# Patient Record
Sex: Female | Born: 1970 | Race: White | Hispanic: No | Marital: Married | Smoking: Never smoker
Health system: Southern US, Community
[De-identification: ages and names within clinical notes are randomized; demographics above are authoritative.]

## PROBLEM LIST (undated history)

## (undated) DIAGNOSIS — F329 Major depressive disorder, single episode, unspecified: Secondary | ICD-10-CM

## (undated) DIAGNOSIS — F4321 Adjustment disorder with depressed mood: Secondary | ICD-10-CM

## (undated) DIAGNOSIS — N393 Stress incontinence (female) (male): Secondary | ICD-10-CM

## (undated) DIAGNOSIS — N390 Urinary tract infection, site not specified: Secondary | ICD-10-CM

## (undated) DIAGNOSIS — E039 Hypothyroidism, unspecified: Secondary | ICD-10-CM

## (undated) DIAGNOSIS — J45909 Unspecified asthma, uncomplicated: Secondary | ICD-10-CM

## (undated) DIAGNOSIS — D069 Carcinoma in situ of cervix, unspecified: Secondary | ICD-10-CM

## (undated) DIAGNOSIS — R51 Headache: Secondary | ICD-10-CM

## (undated) DIAGNOSIS — R519 Headache, unspecified: Secondary | ICD-10-CM

## (undated) DIAGNOSIS — R635 Abnormal weight gain: Secondary | ICD-10-CM

## (undated) DIAGNOSIS — E079 Disorder of thyroid, unspecified: Secondary | ICD-10-CM

## (undated) DIAGNOSIS — R9431 Abnormal electrocardiogram [ECG] [EKG]: Principal | ICD-10-CM

## (undated) DIAGNOSIS — K219 Gastro-esophageal reflux disease without esophagitis: Secondary | ICD-10-CM

## (undated) DIAGNOSIS — R5383 Other fatigue: Secondary | ICD-10-CM

## (undated) HISTORY — PX: WISDOM TOOTH EXTRACTION: SHX21

## (undated) HISTORY — DX: Other fatigue: R53.83

## (undated) HISTORY — DX: Stress incontinence (female) (male): N39.3

## (undated) HISTORY — DX: Carcinoma in situ of cervix, unspecified: D06.9

## (undated) HISTORY — DX: Gastro-esophageal reflux disease without esophagitis: K21.9

## (undated) HISTORY — DX: Major depressive disorder, single episode, unspecified: F32.9

## (undated) HISTORY — DX: Abnormal electrocardiogram (ECG) (EKG): R94.31

## (undated) HISTORY — DX: Abnormal weight gain: R63.5

## (undated) HISTORY — DX: Hypothyroidism, unspecified: E03.9

## (undated) HISTORY — DX: Urinary tract infection, site not specified: N39.0

## (undated) HISTORY — DX: Adjustment disorder with depressed mood: F43.21

---

## 1992-02-08 DIAGNOSIS — D069 Carcinoma in situ of cervix, unspecified: Secondary | ICD-10-CM

## 1992-02-08 HISTORY — DX: Carcinoma in situ of cervix, unspecified: D06.9

## 1997-10-10 ENCOUNTER — Encounter: Admission: RE | Admit: 1997-10-10 | Discharge: 1998-01-08 | Payer: Self-pay | Admitting: Obstetrics and Gynecology

## 1998-08-07 ENCOUNTER — Other Ambulatory Visit: Admission: RE | Admit: 1998-08-07 | Discharge: 1998-08-07 | Payer: Self-pay | Admitting: *Deleted

## 1999-09-14 ENCOUNTER — Other Ambulatory Visit: Admission: RE | Admit: 1999-09-14 | Discharge: 1999-09-14 | Payer: Self-pay | Admitting: *Deleted

## 2001-10-01 ENCOUNTER — Encounter: Payer: Self-pay | Admitting: Obstetrics & Gynecology

## 2001-10-01 ENCOUNTER — Inpatient Hospital Stay (HOSPITAL_COMMUNITY): Admission: AD | Admit: 2001-10-01 | Discharge: 2001-10-01 | Payer: Self-pay | Admitting: Obstetrics & Gynecology

## 2001-10-02 ENCOUNTER — Ambulatory Visit (HOSPITAL_COMMUNITY): Admission: AD | Admit: 2001-10-02 | Discharge: 2001-10-02 | Payer: Self-pay | Admitting: Obstetrics & Gynecology

## 2001-10-02 ENCOUNTER — Encounter (INDEPENDENT_AMBULATORY_CARE_PROVIDER_SITE_OTHER): Payer: Self-pay

## 2001-10-02 ENCOUNTER — Encounter: Payer: Self-pay | Admitting: Obstetrics & Gynecology

## 2002-11-25 ENCOUNTER — Other Ambulatory Visit: Admission: RE | Admit: 2002-11-25 | Discharge: 2002-11-25 | Payer: Self-pay | Admitting: Obstetrics and Gynecology

## 2002-11-26 ENCOUNTER — Encounter: Admission: RE | Admit: 2002-11-26 | Discharge: 2002-11-26 | Payer: Self-pay | Admitting: Obstetrics and Gynecology

## 2002-11-26 ENCOUNTER — Encounter: Payer: Self-pay | Admitting: Obstetrics and Gynecology

## 2003-12-09 ENCOUNTER — Other Ambulatory Visit: Admission: RE | Admit: 2003-12-09 | Discharge: 2003-12-09 | Payer: Self-pay | Admitting: Obstetrics and Gynecology

## 2004-09-09 HISTORY — PX: ABDOMINAL HYSTERECTOMY: SHX81

## 2005-01-23 ENCOUNTER — Other Ambulatory Visit: Admission: RE | Admit: 2005-01-23 | Discharge: 2005-01-23 | Payer: Self-pay | Admitting: Obstetrics and Gynecology

## 2005-01-23 ENCOUNTER — Other Ambulatory Visit: Admission: RE | Admit: 2005-01-23 | Discharge: 2005-01-23 | Payer: Self-pay | Admitting: Obstetrics & Gynecology

## 2005-04-04 ENCOUNTER — Other Ambulatory Visit: Admission: RE | Admit: 2005-04-04 | Discharge: 2005-04-04 | Payer: Self-pay | Admitting: Obstetrics and Gynecology

## 2005-05-30 ENCOUNTER — Encounter (INDEPENDENT_AMBULATORY_CARE_PROVIDER_SITE_OTHER): Payer: Self-pay | Admitting: Specialist

## 2005-05-30 ENCOUNTER — Observation Stay (HOSPITAL_COMMUNITY): Admission: RE | Admit: 2005-05-30 | Discharge: 2005-05-31 | Payer: Self-pay | Admitting: Obstetrics and Gynecology

## 2006-04-10 ENCOUNTER — Other Ambulatory Visit: Admission: RE | Admit: 2006-04-10 | Discharge: 2006-04-10 | Payer: Self-pay | Admitting: Obstetrics and Gynecology

## 2008-05-09 ENCOUNTER — Encounter: Admission: RE | Admit: 2008-05-09 | Discharge: 2008-05-09 | Payer: Self-pay | Admitting: Obstetrics and Gynecology

## 2009-01-06 ENCOUNTER — Encounter: Admission: RE | Admit: 2009-01-06 | Discharge: 2009-01-06 | Payer: Self-pay | Admitting: Obstetrics and Gynecology

## 2009-06-16 ENCOUNTER — Encounter: Admission: RE | Admit: 2009-06-16 | Discharge: 2009-06-16 | Payer: Self-pay | Admitting: Obstetrics and Gynecology

## 2010-07-06 ENCOUNTER — Encounter: Admission: RE | Admit: 2010-07-06 | Discharge: 2010-07-06 | Payer: Self-pay | Admitting: Obstetrics and Gynecology

## 2010-09-30 ENCOUNTER — Encounter: Payer: Self-pay | Admitting: Obstetrics and Gynecology

## 2011-01-25 NOTE — Discharge Summary (Signed)
NAMEGEENA, Brandi Hampton              ACCOUNT NO.:  [id number]   MEDICAL RECORD NO.:  [id number]          PATIENT TYPE:  OBV   LOCATION:  9302                          FACILITY:  [facility]   PHYSICIAN:  Janine Limbo, M.D.DATE OF BIRTH:  [birth year]   DATE OF ADMISSION:  05/30/2005  DATE OF DISCHARGE:                                 DISCHARGE SUMMARY   ADMISSION DIAGNOSIS:  High grade squamous intraepithelial lesion of the  cervix.   DISCHARGE DIAGNOSES:  1.  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  2.  Anemia secondary to surgery.   PROCEDURES THIS ADMISSION:  May 30, 2005 - vaginal hysterectomy.   HISTORY OF PRESENT ILLNESS:  Ms. Batchelder is a 40 year old female para 0-2-0-2  who presents with the above-mentioned diagnosis. Please see her dictated  history and physical exam for details.   ADMISSION EXAMINATION:  The uterus was normal size and shape and the cervix  was normal except for colposcopic changes.   HOSPITAL COURSE:  On day of admission the patient had a vaginal hysterectomy  without difficulty. The estimated blood loss was 50 cc. The patient  tolerated her procedure well. Her postoperative hemoglobin was 11.9  (preoperative hemoglobin was 13.9). The patient quickly tolerated a regular  diet. By postoperative day #1 she was voiding without difficulty. She  tolerated her regular diet. She was ready for discharge.   DISCHARGE INSTRUCTIONS:  The patient will refrain from driving for 1 week,  heavy lifting for 4 weeks, and intercourse for 6 weeks. She will call for  questions or concerns. She will call for temperature greater than 100.4. She  was given a copy of the postoperative instruction sheet as prepared by the  [facility] division of [facility].   DISCHARGE MEDICATIONS:  1.  Percocet 5/325 one tablet to two tablets every 4-6 hours as needed for      pain.  2.  Ibuprofen 600 mg every 6 hours as needed for pain.  3.  Phenergan 25 mg every 6 hours as needed for nausea.   FINAL PATHOLOGY REPORT:  Pending.      Janine Limbo, M.D.  Electronically Signed     AVS/MEDQ  D:  05/31/2005  T:  05/31/2005  Job:  [id number]

## 2011-01-25 NOTE — Op Note (Signed)
[facility]  Patient:    Brandi Hampton, Brandi Hampton Visit Number: [id number] MRN: [id number]          Service Type: DSU Location: [facility] Attending Physician:  Antionette Char Dictated by:   Roseanna Rainbow, M.D. Proc. Date: 10/02/01 Admit Date:  10/02/2001 Discharge Date: 10/02/2001                             Operative Report  PREOPERATIVE DIAGNOSIS:       Retained products of conception.  POSTOPERATIVE DIAGNOSIS:      Retained products of conception.  PROCEDURE:                    Suction dilation and curettage with ultrasound guidance.  SURGEON:                      Roseanna Rainbow, M.D.  ANESTHESIA:                   Paracervical block and managed anesthesia care.  ESTIMATED BLOOD LOSS:         100 cc.  COMPLICATIONS:                None.  DESCRIPTION OF PROCEDURE:     The patient was taken to the operating room. She was placed in the dorsal lithotomy position, prepped and draped in the usual sterile fashion.  A sterile speculum was placed in the patients vagina and the cervix was noted to be approximately 2 cm dilated with products of conception noted at the external os.  The anterior lip of the cervix was infiltrated with 1% lidocaine.  A Jacobs tenaculum was applied to his location and 4 cc of lidocaine was injected at 4 and 7 oclock to produce a paracervical block.  The products of conception at the external os was then removed with an empty ring sponge stick.  A 10 mm suction curet was then introduced into the uterine fundus and the curet rotated to clear the uterus of remaining products of conception.  A sharp curettage was then performed. Ultrasound was performed and the endometrial strip was felt to be thin and consistent with complete evacuation of products of conception.  The Jacobs tenaculum was removed with minimal bleeding noted from the cervix.  The speculum was removed from the vagina.  The patient tolerated the  procedure well.  She was taken to the PACU awake and in stable condition. Dictated by:   Roseanna Rainbow, M.D. Attending Physician:  Antionette Char DD:  10/06/01 TD:  10/06/01 Job: [id number]

## 2011-01-25 NOTE — Op Note (Signed)
Brandi Hampton, Brandi Hampton              ACCOUNT NO.:  [id number]   MEDICAL RECORD NO.:  [id number]          PATIENT TYPE:  OBV   LOCATION:  9399                          [facility]ACILITY:  [facility]   PHYSICIAN:  Brandi Hampton, M.D.DATE OF BIRTH:  Jun 11, 1971   DATE OF PROCEDURE:  05/30/2005  DATE OF DISCHARGE:                                 OPERATIVE REPORT   PREOPERATIVE DIAGNOSIS:  Cervical intraepithelial neoplasia III.   POSTOPERATIVE DIAGNOSIS:  Cervical intraepithelial neoplasia III.   PROCEDURE:  Vaginal hysterectomy.   SURGEON:  Brandi Hampton.   FIRST ASSISTANT:  Brandi Hampton.   ANESTHETIC:  General.   DISPOSITION:  Brandi Hampton is 40 year old female, para 0-2-0-2, who presents  with cervical intraepithelial neoplasia III as diagnosed by colposcopically-  directed biopsies.  Her endocervical curettage was negative.  Her transition  zone was completely seen.  The patient's husband has already had a  vasectomy.  She desires no further childbearing potential.  We discussed our  options for management, which included conization of the cervix as well as  hysterectomy.  The patient considered her options but elected to proceed  with vaginal hysterectomy.  She understands the indications for her  procedure and she accepts the risk of, but not limited to, anesthetic  complications, bleeding, infection, and possible damage to the surrounding  organs.   FINDINGS:  The uterus was upper limits normal size.  The fallopian tubes and  the ovaries appeared normal.  There was no evidence of parametrial disease.   PROCEDURE:  The patient was taken to the operating room, where a general  anesthetic was given.  The patient's abdomen, perineum, and vagina were  prepped with multiple layers of Betadine.  Examination under anesthesia was  performed.  A Foley catheter was placed in the bladder.  The patient was  then sterilely draped.  A weighted speculum was placed in the vagina.   The  cervix was injected with 30 mL of a diluted solution of Pitressin and  saline.  A circumferential incision was made around the cervix removing the  entire transition zone and the exocervix.  The vaginal mucosa was advanced  both anteriorly and posteriorly.  The posterior cul-de-sac was sharply  entered.  Alternating from right to left the uterosacral ligaments,  paracervical tissues, parametrial tissues, and the uterine arteries were  clamped, cut, sutured, and tied securely.  The uterus was inverted through  the posterior colpotomy.  The remainder of the upper pedicles were secured.  The uterus was transected from our operative field.  The upper pedicles were  then free tied and then suture ligated.  Hemostasis was noted to be  adequate.  The sutures attached to the uterosacral ligaments were brought out through  the vaginal angles and tied securely.  A McCall culdoplasty suture was  placed in the posterior cul-de-sac incorporating the uterosacral ligaments  bilaterally and the posterior vaginal mucosa.  Again hemostasis was noted be  adequate.  At this point we felt that we were ready to terminate the  procedure.  The vaginal cuff was closed using figure-of-eight sutures  incorporating the anterior vaginal mucosa, the anterior peritoneum, the  posterior peritoneum, and the posterior vaginal mucosa.  The McCall  culdoplasty suture was tied securely and the apex of the vagina was noted to  elevate into the midpelvis.  Sponge, needle, and instrument counts were  correct on two occasions.  The estimated blood loss for the procedure  was 50 mL. The patient tolerated her procedure well.  She was noted to drain  clear yellow urine at the end of our procedure.  She was awakened from her  anesthetic without difficulty and then taken to the recovery room in stable  condition.      Brandi Hampton, M.D.  Electronically Signed     AVS/MEDQ  D:  05/30/2005  T:  05/30/2005  Job:   [id number]

## 2011-01-25 NOTE — H&P (Signed)
NAME:  Brandi Hampton, Brandi Hampton              ACCOUNT NO.:  [id number]   MEDICAL RECORD NO.:  [id number]          PATIENT TYPE:  AMB   LOCATION:  SDC                           [facility]ACILITY:  [facility]   PHYSICIAN:  Janine Limbo, M.D.DATE OF BIRTH:  February 09, 1971   DATE OF ADMISSION:  05/30/2005  DATE OF DISCHARGE:                                HISTORY & PHYSICAL   HISTORY OF PRESENT ILLNESS:  Ms. Colgate is a 40 year old female, para 0-2-0-  2, who presents for a vaginal hysterectomy.  The patient has been followed  at the [facility] division of [facility].  The  patient had an abnormal Pap smear in 1993 and had laser surgery at that  time.  The patient had a follow-up Pap smear in May of 2006 that showed CIN-  2.  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  The patient has had a  negative test for the human papillomavirus (April 04, 2005).   PAST OBSTETRICAL HISTORY:  The patient has had two vaginal deliveries (35  weeks and then 36 weeks).  The patient's husband has had a vasectomy.   ALLERGIES:  SEPTRA and CLINDAMYCIN.   PAST MEDICAL HISTORY:  The patient has a history of fibrocystic breast  changes.  She has had laser surgery as mentioned previously.  The patient  has had recurrent urinary tract infections.  She has a past history of  asthma.  She had her wisdom teeth removed in 1990.   REVIEW OF SYSTEMS:  Noncontributory.   SOCIAL HISTORY:  The patient is married and she works as a Sales executive.  She denies cigarette use, alcohol use, and recreational drug use.   FAMILY HISTORY:  The patient has a first cousin with breast cancer.  She  also has a family history of thyroid disease and Crohn's disease.   PHYSICAL EXAMINATION:  VITAL SIGNS:  Weight is 150 pounds.  HEENT:  Within normal limits.  CHEST:  Clear.  HEART:  Regular rate and rhythm.  BREASTS:  Without masses.  ABDOMEN:  Nontender.  EXTREMITIES:  Grossly normal.  NEUROLOGY:  Grossly normal.  PELVIC:  External genitalia is normal.  Vagina is normal.  Cervix is normal  except for colposcopic changes.  Uterus is upper limits of normal size and  nontender.  There is no parametrial disease.  Adnexa no masses.  Rectovaginal examination confirms.   ASSESSMENT:  Cervical intraepithelial neoplasia - 3 (high grade changes).   PLAN:  We have reviewed our options for management.  The patient has  carefully considered her options and she wishes to proceed with vaginal  hysterectomy.  She understands the indications for her procedure and she  understands the risks of, but not limited to, anesthetic complications,  bleeding, and possible damage to the surrounding organs.      Janine Limbo, M.D.  Electronically Signed     AVS/MEDQ  D:  05/29/2005  T:  05/29/2005  Job:  [id number] 

## 2011-05-27 ENCOUNTER — Inpatient Hospital Stay (INDEPENDENT_AMBULATORY_CARE_PROVIDER_SITE_OTHER)
Admission: RE | Admit: 2011-05-27 | Discharge: 2011-05-27 | Disposition: A | Discharge status: Home / Self Care | Payer: BC Managed Care – PPO | Source: Ambulatory Visit | Attending: Family Medicine | Admitting: Family Medicine

## 2011-05-27 DIAGNOSIS — R10816 Epigastric abdominal tenderness: Secondary | ICD-10-CM

## 2011-10-25 ENCOUNTER — Other Ambulatory Visit: Payer: Self-pay | Admitting: Obstetrics and Gynecology

## 2011-10-25 DIAGNOSIS — Z1231 Encounter for screening mammogram for malignant neoplasm of breast: Secondary | ICD-10-CM

## 2011-11-12 ENCOUNTER — Ambulatory Visit
Admission: RE | Admit: 2011-11-12 | Discharge: 2011-11-12 | Disposition: A | Discharge status: Home / Self Care | Payer: BC Managed Care – PPO | Source: Ambulatory Visit | Attending: Obstetrics and Gynecology | Admitting: Obstetrics and Gynecology

## 2011-11-12 DIAGNOSIS — Z1231 Encounter for screening mammogram for malignant neoplasm of breast: Secondary | ICD-10-CM

## 2012-07-29 ENCOUNTER — Emergency Department (INDEPENDENT_AMBULATORY_CARE_PROVIDER_SITE_OTHER): Payer: BC Managed Care – PPO

## 2012-07-29 ENCOUNTER — Encounter (HOSPITAL_COMMUNITY): Payer: Self-pay

## 2012-07-29 ENCOUNTER — Emergency Department (HOSPITAL_COMMUNITY)
Admission: EM | Admit: 2012-07-29 | Discharge: 2012-07-29 | Disposition: A | Discharge status: Home / Self Care | Payer: BC Managed Care – PPO | Attending: Emergency Medicine | Admitting: Emergency Medicine

## 2012-07-29 ENCOUNTER — Emergency Department (HOSPITAL_COMMUNITY)
Admission: EM | Admit: 2012-07-29 | Discharge: 2012-07-29 | Disposition: A | Discharge status: Nursing Home (Includes ICF) | Payer: BC Managed Care – PPO | Source: Home / Self Care | Attending: Emergency Medicine | Admitting: Emergency Medicine

## 2012-07-29 ENCOUNTER — Encounter (HOSPITAL_COMMUNITY): Payer: Self-pay | Admitting: Physical Medicine and Rehabilitation

## 2012-07-29 ENCOUNTER — Emergency Department (HOSPITAL_COMMUNITY): Payer: BC Managed Care – PPO

## 2012-07-29 DIAGNOSIS — R109 Unspecified abdominal pain: Secondary | ICD-10-CM | POA: Insufficient documentation

## 2012-07-29 DIAGNOSIS — R52 Pain, unspecified: Secondary | ICD-10-CM

## 2012-07-29 LAB — CBC WITH DIFFERENTIAL/PLATELET
Basophils Absolute: 0 10*3/uL (ref 0.0–0.1)
Basophils Relative: 1 % (ref 0–1)
Eosinophils Absolute: 0 10*3/uL (ref 0.0–0.7)
Eosinophils Relative: 1 % (ref 0–5)
HCT: 42.1 % (ref 36.0–46.0)
Lymphocytes Relative: 22 % (ref 12–46)
Lymphs Abs: 1.4 10*3/uL (ref 0.7–4.0)
MCHC: 34.2 g/dL (ref 30.0–36.0)
Monocytes Absolute: 0.3 10*3/uL (ref 0.1–1.0)
Neutro Abs: 4.8 10*3/uL (ref 1.7–7.7)
Neutrophils Relative %: 73 % (ref 43–77)

## 2012-07-29 LAB — COMPREHENSIVE METABOLIC PANEL
ALT: 12 U/L (ref 0–35)
AST: 18 U/L (ref 0–37)
Albumin: 4.6 g/dL (ref 3.5–5.2)
Alkaline Phosphatase: 84 U/L (ref 39–117)
BUN: 10 mg/dL (ref 6–23)
CO2: 24 mEq/L (ref 19–32)
Chloride: 103 mEq/L (ref 96–112)
GFR calc non Af Amer: >90 mL/min (ref >90)
Sodium: 140 mEq/L (ref 135–145)
Total Bilirubin: 0.4 mg/dL (ref 0.3–1.2)
Total Protein: 7.8 g/dL (ref 6.0–8.3)

## 2012-07-29 LAB — POCT URINALYSIS DIP (DEVICE)
Bilirubin Urine: NEGATIVE
Hgb urine dipstick: NEGATIVE
Ketones, ur: NEGATIVE mg/dL
Leukocytes, UA: NEGATIVE
Protein, ur: NEGATIVE mg/dL
pH: 7 (ref 5.0–8.0)

## 2012-07-29 LAB — LIPASE, BLOOD: Lipase: 21 U/L (ref 11–59)

## 2012-07-29 MED ORDER — HYDROCODONE-ACETAMINOPHEN 5-325 MG PO TABS: 1.0000 | ORAL_TABLET | Freq: Four times a day (QID) | ORAL | Status: DC | PRN

## 2012-07-29 MED ORDER — HYDROMORPHONE HCL PF 1 MG/ML IJ SOLN
1.0000 mg | Freq: Once | INTRAMUSCULAR | Status: AC
  Administered 2012-07-29: 1 mg via INTRAVENOUS
  Filled 2012-07-29: qty 1

## 2012-07-29 MED ORDER — ONDANSETRON 4 MG PO TBDP: 4.0000 mg | ORAL_TABLET | Freq: Three times a day (TID) | ORAL | Status: DC | PRN

## 2012-07-29 MED ORDER — SODIUM CHLORIDE 0.9 % IV BOLUS (SEPSIS)
1000.0000 mL | Freq: Once | INTRAVENOUS | Status: AC
  Administered 2012-07-29: 1000 mL via INTRAVENOUS

## 2012-07-29 MED ORDER — ONDANSETRON HCL 4 MG/2ML IJ SOLN
4.0000 mg | Freq: Once | INTRAMUSCULAR | Status: AC
  Administered 2012-07-29: 4 mg via INTRAVENOUS
  Filled 2012-07-29: qty 2

## 2012-07-29 MED ORDER — HYDROCODONE-ACETAMINOPHEN 5-325 MG PO TABS
ORAL_TABLET | ORAL | Status: AC
  Filled 2012-07-29: qty 2

## 2012-07-29 MED ORDER — KETOROLAC TROMETHAMINE 30 MG/ML IJ SOLN
30.0000 mg | Freq: Once | INTRAMUSCULAR | Status: AC
  Administered 2012-07-29: 30 mg via INTRAVENOUS
  Filled 2012-07-29: qty 1

## 2012-07-29 MED ORDER — HYDROCODONE-ACETAMINOPHEN 5-325 MG PO TABS
2.0000 | ORAL_TABLET | Freq: Once | ORAL | Status: AC
  Administered 2012-07-29: 2 via ORAL

## 2012-07-29 NOTE — ED Provider Notes (Signed)
History     CSN: [id number]  Arrival date & time 07/29/12  1606   First MD Initiated Contact with Patient 07/29/12 1647      Chief Complaint  Patient presents with  . Flank Pain    (Consider location/radiation/quality/duration/timing/severity/associated sxs/prior treatment) HPI The patient presents via urgent care with persistent right flank pain.  She recalls that the pain started approximately 8 hours ago, subacutely.  Since onset the pain has been severe right-sided, not improved with OTC medication.  The pain is nonradiating.  She claims generalized discomfort, mild nausea.  She denies dysuria, vomiting, diarrhea, vaginal bleeding or discharge.  She states that this is the first time she has had similar pain.  The patient was seen at urgent care, evaluated with urinalysis, x-ray, which were both unremarkable.  Given the persistency of her pain she was transferred here for further evaluation and management. No past medical history on file.  Past Surgical History  Procedure Date  . Abdominal hysterectomy     No family history on file.  History  Substance Use Topics  . Smoking status: Never Smoker   . Smokeless tobacco: Not on file  . Alcohol Use: No    OB History    Grav Para Term Preterm Abortions TAB SAB Ect Mult Living                  Review of Systems  Constitutional:       Per HPI, otherwise negative  HENT:       Per HPI, otherwise negative  Eyes: Negative.   Respiratory:       Per HPI, otherwise negative  Cardiovascular:       Per HPI, otherwise negative  Gastrointestinal: Negative for vomiting.  Genitourinary: Positive for flank pain. Negative for dysuria, urgency, frequency, hematuria, decreased urine volume, vaginal bleeding, vaginal discharge, difficulty urinating, vaginal pain and pelvic pain.  Musculoskeletal:       Per HPI, otherwise negative  Skin: Negative.   Neurological: Negative for syncope and weakness.    Allergies  Review of  patient's allergies indicates no known allergies.  Home Medications   Current Outpatient Rx  Name  Route  Sig  Dispense  Refill  . CITALOPRAM HYDROBROMIDE 10 MG PO TABS   Oral   Take 10 mg by mouth daily.         . CYCLOBENZAPRINE HCL 10 MG PO TABS   Oral   Take 10 mg by mouth 3 (three) times daily as needed.         Marland Kitchen LEVOTHYROXINE SODIUM 75 MCG PO TABS   Oral   Take 75 mcg by mouth daily.         Marland Kitchen NAPROXEN 500 MG PO TABS   Oral   Take 500 mg by mouth daily as needed. For headache         . TOPIRAMATE 25 MG PO TABS   Oral   Take 25 mg by mouth at bedtime.            BP 150/87  Pulse 106  Temp 97.5 F (36.4 C) (Oral)  Resp 18  SpO2 100%  Physical Exam  Nursing note and vitals reviewed. Constitutional: She is oriented to person, place, and time. She appears well-developed and well-nourished. No distress.  HENT:  Head: Normocephalic and atraumatic.  Eyes: Conjunctivae normal and EOM are normal.  Cardiovascular: Normal rate and regular rhythm.   Pulmonary/Chest: Effort normal and breath sounds normal. No stridor. No respiratory  distress.  Abdominal: Soft. Normal appearance and bowel sounds are normal. She exhibits no distension. There is no tenderness. There is CVA tenderness. There is no rigidity, no rebound and no guarding.  Musculoskeletal: She exhibits no edema.  Neurological: She is alert and oriented to person, place, and time. No cranial nerve deficit.  Skin: Skin is warm and dry.  Psychiatric: She has a normal mood and affect.    ED Course  Procedures (including critical care time)   Labs Reviewed  CBC WITH DIFFERENTIAL  COMPREHENSIVE METABOLIC PANEL  LIPASE, BLOOD   Dg Abd 1 View  07/29/2012  *RADIOLOGY REPORT*  Clinical Data: Right-sided back pain.  History stones.  Partial hysterectomy.  ABDOMEN - 1 VIEW  Comparison: None.  Findings: No plain film evidence of renal or ureteral calculi.  Small calcification right aspect of pelvic inlet  suggestive of phleboliths.  Mild degenerative changes lower lumbar spine.  IMPRESSION: No plain film evidence of renal or ureteral calculi.   Original Report Authenticated By: Lacy Duverney, M.D.      No diagnosis found.  8:37 PM Patient and her husband informed of results.  VS remain unremarkable.  She is persistently nauseous.  Update: She feels marginally better.   MDM  The patient presents with ongoing flank pain.  On exam the patient is afebrile, though mildly tachycardic.  Following ED interventions the patient was improved, though she continues to have some pain.  Her evaluation was largely unremarkable, with a negative CT scan, labs, urinalysis.  Given the absence of notable findings, the patient's improvement, we discussed discharge versus admission for further evaluation and management.  The patient stated that she wanted to go home.  I advised her of return precautions, the need for continued management of her pain.  Gerhard Munch, MD 07/30/12 Rich Fuchs

## 2012-07-29 NOTE — ED Notes (Signed)
Question of poss kidney stone ; had pain, felt general body aches, fatigue ; pain back

## 2012-07-29 NOTE — ED Provider Notes (Addendum)
History     CSN: [id number]  Arrival date & time 07/29/12  1251   First MD Initiated Contact with Patient 07/29/12 1436      Chief Complaint  Patient presents with  . Flank Pain    (Consider location/radiation/quality/duration/timing/severity/associated sxs/prior treatment) HPI Comments: Patient presents urgent care complaining of sudden onset of right upper back pain (patient points to right flank region), pain is exacerbated with movement and activity denies any urinary symptoms. Feels somewhat nauseous no vomiting or fevers, no dysuria, no diarrheas or abdominal pain. Patient denies any current cough, shortness of breath or chest pains.  Patient is a 41 y.o. female presenting with flank pain. The history is provided by the patient and the spouse.  Flank Pain This is a new problem. The problem occurs constantly. The problem has been gradually worsening. Pertinent negatives include no abdominal pain and no shortness of breath. The symptoms are aggravated by bending and twisting. Nothing relieves the symptoms. She has tried nothing for the symptoms. The treatment provided no relief.    History reviewed. No pertinent past medical history.  Past Surgical History  Procedure Date  . Abdominal hysterectomy     History reviewed. No pertinent family history.  History  Substance Use Topics  . Smoking status: Never Smoker   . Smokeless tobacco: Not on file  . Alcohol Use: No    OB History    Grav Para Term Preterm Abortions TAB SAB Ect Mult Living                  Review of Systems  Constitutional: Negative for fever, chills, diaphoresis, activity change, appetite change and fatigue.  Respiratory: Negative for shortness of breath.   Gastrointestinal: Positive for nausea. Negative for vomiting, abdominal pain and diarrhea.  Genitourinary: Positive for flank pain. Negative for dysuria, frequency, hematuria, difficulty urinating, genital sores and dyspareunia.  Skin: Negative  for rash.    Allergies  Review of patient's allergies indicates no known allergies.  Home Medications   Current Outpatient Rx  Name  Route  Sig  Dispense  Refill  . CITALOPRAM HYDROBROMIDE 10 MG PO TABS   Oral   Take 10 mg by mouth daily.         . CYCLOBENZAPRINE HCL 10 MG PO TABS   Oral   Take 10 mg by mouth 3 (three) times daily as needed.         . TOPIRAMATE 25 MG PO TABS   Oral   Take 25 mg by mouth at bedtime.          Marland Kitchen LEVOTHYROXINE SODIUM 75 MCG PO TABS   Oral   Take 75 mcg by mouth daily.         Marland Kitchen NAPROXEN 500 MG PO TABS   Oral   Take 500 mg by mouth daily as needed. For headache           BP 143/94  Pulse 108  Temp 98.8 F (37.1 C) (Oral)  Resp 20  SpO2 100%  Physical Exam  Nursing note and vitals reviewed. Constitutional: She appears well-nourished.  Non-toxic appearance. She does not have a sickly appearance. She does not appear ill. She appears distressed.  Cardiovascular: Normal rate.   Pulmonary/Chest: Effort normal and breath sounds normal. No respiratory distress. She has no wheezes.  Abdominal: Soft. She exhibits no distension and no mass. There is no hepatosplenomegaly or hepatomegaly. There is no tenderness. There is CVA tenderness. There is no rebound and no  guarding.  Musculoskeletal: She exhibits tenderness.       Back:  Neurological: She is alert.  Skin: Skin is warm. No erythema.    ED Course  Procedures (including critical care time)   Labs Reviewed  POCT URINALYSIS DIP (DEVICE)   Dg Abd 1 View  07/29/2012  *RADIOLOGY REPORT*  Clinical Data: Right-sided back pain.  History stones.  Partial hysterectomy.  ABDOMEN - 1 VIEW  Comparison: None.  Findings: No plain film evidence of renal or ureteral calculi.  Small calcification right aspect of pelvic inlet suggestive of phleboliths.  Mild degenerative changes lower lumbar spine.  IMPRESSION: No plain film evidence of renal or ureteral calculi.   Original Report  Authenticated By: Lacy Duverney, M.D.      1. Flank pain, acute       MDM  Right flank pain. Sudden onset. No microscopic hematuria. Afebrile. Patient a KUB that noted no visible uretero- lithiasis. As patient continues with significant discomfort after oral pain management was provided at urgent care week decided to transfer patient to the emergency department for further evaluation.      Jimmie Molly, MD 07/29/12 1856  Jimmie Molly, MD 07/29/12 [phone]

## 2012-07-29 NOTE — ED Notes (Signed)
Pt presents to department for evaluation of R sided flank pain. States severe pain this morning. 7/10 upon arrival. Pt describes as constant dull pain to R flank. Also states body aches, generalized fatigue and nausea. Denies dysuria. She is conscious alert and oriented x4. Ambulatory to triage.

## 2012-08-19 ENCOUNTER — Encounter: Payer: Self-pay | Admitting: Obstetrics and Gynecology

## 2012-08-19 ENCOUNTER — Ambulatory Visit (INDEPENDENT_AMBULATORY_CARE_PROVIDER_SITE_OTHER): Payer: BC Managed Care – PPO | Admitting: Obstetrics and Gynecology

## 2012-08-19 VITALS — BP 124/78 | Resp 18 | Ht 68.0 in | Wt 184.0 lb

## 2012-08-19 DIAGNOSIS — Z01419 Encounter for gynecological examination (general) (routine) without abnormal findings: Secondary | ICD-10-CM

## 2012-08-19 MED ORDER — CITALOPRAM HYDROBROMIDE 20 MG PO TABS: 20.0000 mg | ORAL_TABLET | Freq: Every day | ORAL | Status: DC

## 2012-08-19 NOTE — Addendum Note (Signed)
Addended by: Tim Lair on: 08/19/2012 04:05 PM   Modules accepted: Orders

## 2012-08-19 NOTE — Progress Notes (Signed)
ANNUAL GYNECOLOGIC EXAMINATION   Brandi Hampton is a 41 y.o. female, No obstetric history on file., who presents for an annual exam. She is status post vaginal hysterectomy for CIN-3.  Her father is dying from lung cancer.  She takes Celexa 20 mg and is doing well.   History   Social History  . Marital Status: Married    Spouse Name: N/A    Number of Children: N/A  . Years of Education: N/A   Social History Main Topics  . Smoking status: Never Smoker   . Smokeless tobacco: Never Used  . Alcohol Use: No  . Drug Use: No  . Sexually Active: Yes -- Female partner(s)    Birth Control/ Protection: Surgical   Other Topics Concern  . None   Social History Narrative  . None    Menstrual cycle:   LMP: No LMP recorded. Patient has had a hysterectomy.             The following portions of the patient's history were reviewed and updated as appropriate: allergies, current medications, past family history, past medical history, past social history, past surgical history and problem list.  Review of Systems Pertinent items are noted in HPI. Breast:Negative for breast lump,nipple discharge or nipple retraction Gastrointestinal: Negative for abdominal pain, change in bowel habits or rectal bleeding Urinary:negative   Objective:    BP 124/78  Resp 18  Ht 5\' 8"  (1.727 m)  Wt 184 lb (83.462 kg)  BMI 27.98 kg/m2    Weight:  Wt Readings from Last 1 Encounters:  08/19/12 184 lb (83.462 kg)          BMI: Body mass index is 27.98 kg/(m^2).  General Appearance: Alert, appropriate appearance for age. No acute distress HEENT: Grossly normal Neck / Thyroid: Supple, no masses, nodes or enlargement Lungs: clear to auscultation bilaterally Back: No CVA tenderness Breast Exam: No masses or nodes.No dimpling, nipple retraction or discharge. Cardiovascular: Regular rate and rhythm. S1, S2, no murmur Gastrointestinal: Soft, non-tender, no masses or  organomegaly  ++++++++++++++++++++++++++++++++++++++++++++++++++++++++  Pelvic Exam: External genitalia: normal general appearance Vaginal: normal without tenderness, induration or masses. Relaxation: Yes Cervix: normal appearance Adnexa: normal bimanual exam Uterus: normal size, shape, and consistency Rectovaginal: normal rectal, no masses  ++++++++++++++++++++++++++++++++++++++++++++++++++++++++  Lymphatic Exam: Non-palpable nodes in neck, clavicular, axillary, or inguinal regions Neurologic: Normal speech, no tremor  Psychiatric: Alert and oriented, appropriate affect.  Assessment:    Normal gyn exam   Overweight or obese: Yes   Pelvic relaxation: Yes  History of CIN-3, status post vaginal hysterectomy.  Depression  Hypothyroidism   Plan:    mammogram return annually or prn Contraception:no method    Medications prescribed: Celexa 20 mg daily, 90 tablets, refill for one year  STD screen request: No   The updated Pap smear screening guidelines were discussed with the patient. The patient requested that I obtain a Pap smear: No.  Kegel exercises discussed: Yes.  Proper diet and regular exercise were reviewed.  Annual mammograms recommended starting at age 64. Proper breast care was discussed.  Screening colonoscopy is recommended beginning at age 26.  Regular health maintenance was reviewed.  Sleep hygiene was discussed.  Adequate calcium and vitamin D intake was emphasized.  Leonard Schwartz M.D.    Regular Periods: no Mammogram: yes  Monthly Breast Ex.: yes Exercise: yes  Tetanus < 10 years: yes Seatbelts: yes  NI. Bladder Functn.: yes Abuse at home: no  Daily BM's: yes Stressful Work: yes  Healthy Diet: yes Sigmoid-Colonoscopy: N/A  Calcium: no Medical problems this year: None   LAST PAP: 07/2011  Contraception: HYST   Mammogram:  07/2012 WNL DENSE BREAST  PCP: [facility]   PMH: no Changes  FMH: No Changes  Last Bone  Scan: Never

## 2012-10-13 ENCOUNTER — Other Ambulatory Visit: Payer: Self-pay | Admitting: Obstetrics and Gynecology

## 2012-10-13 DIAGNOSIS — Z1231 Encounter for screening mammogram for malignant neoplasm of breast: Secondary | ICD-10-CM

## 2012-11-16 ENCOUNTER — Ambulatory Visit
Admission: RE | Admit: 2012-11-16 | Discharge: 2012-11-16 | Disposition: A | Discharge status: Home / Self Care | Payer: BC Managed Care – PPO | Source: Ambulatory Visit | Attending: Obstetrics and Gynecology | Admitting: Obstetrics and Gynecology

## 2012-11-16 DIAGNOSIS — Z1231 Encounter for screening mammogram for malignant neoplasm of breast: Secondary | ICD-10-CM

## 2013-10-14 ENCOUNTER — Other Ambulatory Visit: Payer: Self-pay

## 2013-10-14 DIAGNOSIS — Z1231 Encounter for screening mammogram for malignant neoplasm of breast: Secondary | ICD-10-CM

## 2013-11-18 ENCOUNTER — Ambulatory Visit
Admission: RE | Admit: 2013-11-18 | Discharge: 2013-11-18 | Disposition: A | Discharge status: Home / Self Care | Payer: BC Managed Care – PPO | Source: Ambulatory Visit

## 2013-11-18 DIAGNOSIS — Z1231 Encounter for screening mammogram for malignant neoplasm of breast: Secondary | ICD-10-CM

## 2014-10-25 ENCOUNTER — Other Ambulatory Visit: Payer: Self-pay

## 2014-10-25 DIAGNOSIS — Z1231 Encounter for screening mammogram for malignant neoplasm of breast: Secondary | ICD-10-CM

## 2014-11-22 ENCOUNTER — Ambulatory Visit
Admission: RE | Admit: 2014-11-22 | Discharge: 2014-11-22 | Disposition: A | Discharge status: Home / Self Care | Payer: BLUE CROSS/BLUE SHIELD | Source: Ambulatory Visit

## 2014-11-22 DIAGNOSIS — Z1231 Encounter for screening mammogram for malignant neoplasm of breast: Secondary | ICD-10-CM

## 2015-11-06 ENCOUNTER — Other Ambulatory Visit: Payer: Self-pay

## 2015-11-06 DIAGNOSIS — Z1231 Encounter for screening mammogram for malignant neoplasm of breast: Secondary | ICD-10-CM

## 2015-11-30 ENCOUNTER — Ambulatory Visit
Admission: RE | Admit: 2015-11-30 | Discharge: 2015-11-30 | Disposition: A | Discharge status: Home / Self Care | Payer: BLUE CROSS/BLUE SHIELD | Source: Ambulatory Visit

## 2015-11-30 DIAGNOSIS — Z1231 Encounter for screening mammogram for malignant neoplasm of breast: Secondary | ICD-10-CM

## 2016-11-29 ENCOUNTER — Other Ambulatory Visit: Payer: Self-pay | Admitting: Obstetrics and Gynecology

## 2016-11-29 DIAGNOSIS — Z1231 Encounter for screening mammogram for malignant neoplasm of breast: Secondary | ICD-10-CM

## 2016-12-09 ENCOUNTER — Ambulatory Visit
Admission: RE | Admit: 2016-12-09 | Discharge: 2016-12-09 | Disposition: A | Discharge status: Home / Self Care | Payer: BLUE CROSS/BLUE SHIELD | Source: Ambulatory Visit | Attending: Obstetrics and Gynecology | Admitting: Obstetrics and Gynecology

## 2016-12-09 DIAGNOSIS — Z1231 Encounter for screening mammogram for malignant neoplasm of breast: Secondary | ICD-10-CM

## 2017-04-09 ENCOUNTER — Emergency Department (HOSPITAL_COMMUNITY): Payer: BLUE CROSS/BLUE SHIELD

## 2017-04-09 ENCOUNTER — Emergency Department (HOSPITAL_COMMUNITY)
Admission: EM | Admit: 2017-04-09 | Discharge: 2017-04-09 | Disposition: A | Discharge status: Home / Self Care | Payer: BLUE CROSS/BLUE SHIELD | Attending: Emergency Medicine | Admitting: Emergency Medicine

## 2017-04-09 ENCOUNTER — Encounter (HOSPITAL_COMMUNITY): Payer: Self-pay | Admitting: Emergency Medicine

## 2017-04-09 DIAGNOSIS — R0789 Other chest pain: Secondary | ICD-10-CM | POA: Insufficient documentation

## 2017-04-09 DIAGNOSIS — Z79899 Other long term (current) drug therapy: Secondary | ICD-10-CM | POA: Diagnosis not present

## 2017-04-09 DIAGNOSIS — J45909 Unspecified asthma, uncomplicated: Secondary | ICD-10-CM | POA: Insufficient documentation

## 2017-04-09 DIAGNOSIS — E039 Hypothyroidism, unspecified: Secondary | ICD-10-CM | POA: Insufficient documentation

## 2017-04-09 DIAGNOSIS — R079 Chest pain, unspecified: Secondary | ICD-10-CM | POA: Diagnosis present

## 2017-04-09 HISTORY — DX: Headache, unspecified: R51.9

## 2017-04-09 HISTORY — DX: Headache: R51

## 2017-04-09 HISTORY — DX: Disorder of thyroid, unspecified: E07.9

## 2017-04-09 HISTORY — DX: Unspecified asthma, uncomplicated: J45.909

## 2017-04-09 LAB — BASIC METABOLIC PANEL
ANION GAP: 8 (ref 5–15)
BUN: 15 mg/dL (ref 6–20)
CHLORIDE: 108 mmol/L (ref 101–111)
CO2: 21 mmol/L — ABNORMAL LOW (ref 22–32)
Calcium: 9.2 mg/dL (ref 8.9–10.3)
Creatinine, Ser: 0.91 mg/dL (ref 0.44–1.00)
Glucose, Bld: 116 mg/dL — ABNORMAL HIGH (ref 65–99)
POTASSIUM: 4 mmol/L (ref 3.5–5.1)
SODIUM: 137 mmol/L (ref 135–145)

## 2017-04-09 LAB — CBC
HCT: 42.8 % (ref 36.0–46.0)
Hemoglobin: 14.6 g/dL (ref 12.0–15.0)
MCH: 30.7 pg (ref 26.0–34.0)
MCHC: 34.1 g/dL (ref 30.0–36.0)
MCV: 90.1 fL (ref 78.0–100.0)
Platelets: 206 10*3/uL (ref 150–400)
RBC: 4.75 MIL/uL (ref 3.87–5.11)
RDW: 12.8 % (ref 11.5–15.5)
WBC: 6.2 10*3/uL (ref 4.0–10.5)

## 2017-04-09 LAB — I-STAT TROPONIN, ED
TROPONIN I, POC: 0 ng/mL (ref 0.00–0.08)
Troponin i, poc: 0.02 ng/mL (ref 0.00–0.08)

## 2017-04-09 LAB — D-DIMER, QUANTITATIVE (NOT AT ARMC): D DIMER QUANT: 0.45 ug{FEU}/mL (ref 0.00–0.50)

## 2017-04-09 NOTE — ED Triage Notes (Signed)
Pt arrives via POv from home with substernal sharp chest pain that began last night. Pt reports pain radiates to left arm. Denies recent fever, SOB , weakness, dizziness with the pain. Pt awake, alert, ambulatory, VSS, NAD at present.

## 2017-04-09 NOTE — ED Notes (Signed)
Spoke with lab, they report they have a blue top and will add the d-dimer on.

## 2017-04-09 NOTE — Discharge Instructions (Signed)
We don't find a serious or injuries cause of your chest pain. Call your primary care physician to schedule an appointment. Return if concern for any reason

## 2017-04-09 NOTE — ED Provider Notes (Signed)
[facility]-EMERGENCY DEPT Provider Note   CSN: [id number] Arrival date & time: 04/09/17  [id number]     History   Chief Complaint Chief Complaint  Patient presents with  . Chest Pain    HPI Carol AdaDeborah A Rotunno is a 46 y.o. female.Complains of anterior chest pain onset 7 PM yesterday last one or 2 minutes at a time. Pain awaken her at 5 AM today which is intermittent lasting one or 2 minutes at a time radiates from substernal area to left chest. Does not go to the arm. No associated nausea shortness of breath and sweatiness. Pain is nonexertional. Nothing makes symptoms better or worse. No treatment prior to coming here. No other associated symptoms. She describes the pain as "pinching in nature."  HPI  Past Medical History:  Diagnosis Date  . Asthma   . Headache   . Thyroid disease     There are no active problems to display for this patient.   Past Surgical History:  Procedure Laterality Date  . ABDOMINAL HYSTERECTOMY      OB History    No data available       Home Medications    Prior to Admission medications   Medication Sig Start Date End Date Taking? Authorizing Provider  citalopram (CELEXA) 10 MG tablet Take 10 mg by mouth daily.    [provider]  citalopram (CELEXA) 20 MG tablet Take 1 tablet (20 mg total) by mouth daily. 08/19/12   Kirkland HunStringer, Arthur, MD  cyclobenzaprine (FLEXERIL) 10 MG tablet Take 10 mg by mouth 3 (three) times daily as needed.    [provider]  HYDROcodone-acetaminophen (NORCO/VICODIN) 5-325 MG per tablet Take 1 tablet by mouth every 6 (six) hours as needed for pain. 07/29/12   Gerhard MunchLockwood, Robert, MD  levothyroxine (SYNTHROID, LEVOTHROID) 75 MCG tablet Take 75 mcg by mouth daily.    [provider]  naproxen (NAPROSYN) 500 MG tablet Take 500 mg by mouth daily as needed. For headache    [provider]  ondansetron (ZOFRAN ODT) 4 MG disintegrating tablet Take 1 tablet (4 mg total) by mouth every 8 (eight) hours as  needed for nausea. 07/29/12   Gerhard MunchLockwood, Robert, MD  topiramate (TOPAMAX) 25 MG tablet Take 25 mg by mouth at bedtime.     [provider]    Family History Family History  Problem Relation Age of Onset  . Breast cancer Cousin    Negative for MI Social History Social History  Substance Use Topics  . Smoking status: Never Smoker  . Smokeless tobacco: Never Used  . Alcohol use No     Allergies   Patient has no known allergies.   Review of Systems Review of Systems  Constitutional: Negative.   HENT: Negative.   Respiratory: Negative.   Cardiovascular: Positive for chest pain.  Gastrointestinal: Negative.   Musculoskeletal: Negative.   Skin: Negative.   Neurological: Negative.   Psychiatric/Behavioral: Negative.   All other systems reviewed and are negative.    Physical Exam Updated Vital Signs BP (!) 143/93 (BP Location: Left Arm)   Pulse (!) 109   Temp 98.7 F (37.1 C) (Oral)   Resp 16   Wt 77.1 kg (170 lb)   SpO2 100%   BMI 25.85 kg/m   Physical Exam  Constitutional: She appears well-developed and well-nourished.  HENT:  Head: Normocephalic and atraumatic.  Eyes: Pupils are equal, round, and reactive to light. Conjunctivae are normal.  Neck: Neck supple. No tracheal deviation present. No thyromegaly present.  Cardiovascular: Normal rate and regular rhythm.   No murmur heard. Heart rate counted is 90 bpm by me  Pulmonary/Chest: Effort normal and breath sounds normal.  Abdominal: Soft. Bowel sounds are normal. She exhibits no distension. There is no tenderness.  Musculoskeletal: Normal range of motion. She exhibits no edema or tenderness.  Neurological: She is alert. Coordination normal.  Skin: Skin is warm and dry. No rash noted.  Psychiatric: She has a normal mood and affect.  Nursing note and vitals reviewed.    ED Treatments / Results  Labs (all labs ordered are listed, but only abnormal results are displayed) Labs Reviewed  BASIC  METABOLIC PANEL - Abnormal; Notable for the following:       Result Value   CO2 21 (*)    Glucose, Bld 116 (*)    All other components within normal limits  CBC  D-DIMER, QUANTITATIVE (NOT AT [facility])  I-STAT TROPONIN, ED   Chest x-ray viewed by me EKG  EKG Interpretation  Date/Time:  Wednesday April 09 2017 08:26:40 EDT Ventricular Rate:  105 PR Interval:  122 QRS Duration: 78 QT Interval:  344 QTC Calculation: 454 R Axis:   91 Text Interpretation:  Sinus tachycardia Rightward axis Borderline ECG SINCE LAST TRACING HEART RATE HAS INCREASED Confirmed by Doug SouJacubowitz, Khalessi Blough [phone]([id number]) on 04/09/2017 9:14:49 AM       Radiology Dg Chest 2 View  Result Date: 04/09/2017 CLINICAL DATA:  Chest pain. EXAM: CHEST  2 VIEW COMPARISON:  None. FINDINGS: Mild pectus deformity. Mediastinum and hilar structures are normal. Mild left base subsegmental atelectasis. No pleural effusion or pneumothorax. Mild thoracic spine scoliosis concave right. No acute bony abnormality. IMPRESSION: Mild left base subsegmental atelectasis, otherwise normal chest. Electronically Signed   By: Maisie Fushomas  Register   On: 04/09/2017 09:01    Procedures Procedures (including critical care time)  Medications Ordered in ED Medications - No data to display Chest x-ray viewed by me Results for orders placed or performed during the hospital encounter of 04/09/17  Basic metabolic panel  Result Value Ref Range   Sodium 137 135 - 145 mmol/L   Potassium 4.0 3.5 - 5.1 mmol/L   Chloride 108 101 - 111 mmol/L   CO2 21 (L) 22 - 32 mmol/L   Glucose, Bld 116 (H) 65 - 99 mg/dL   BUN 15 6 - 20 mg/dL   Creatinine, Ser 6.040.91 0.44 - 1.00 mg/dL   Calcium 9.2 8.9 - 54.010.3 mg/dL   GFR calc non Af Amer >60 >60 mL/min   GFR calc Af Amer >60 >60 mL/min   Anion gap 8 5 - 15  CBC  Result Value Ref Range   WBC 6.2 4.0 - 10.5 K/uL   RBC 4.75 3.87 - 5.11 MIL/uL   Hemoglobin 14.6 12.0 - 15.0 g/dL   HCT 98.142.8 19.136.0 - 47.846.0 %   MCV 90.1 78.0 - 100.0 fL    MCH 30.7 26.0 - 34.0 pg   MCHC 34.1 30.0 - 36.0 g/dL   RDW 29.512.8 62.111.5 - 30.815.5 %   Platelets 206 150 - 400 K/uL  D-dimer, quantitative (not at [facility])  Result Value Ref Range   D-Dimer, Quant 0.45 0.00 - 0.50 ug/mL-FEU  I-stat troponin, ED  Result Value Ref Range   Troponin i, poc 0.02 0.00 - 0.08 ng/mL   Comment 3          I-Stat Troponin, ED (not at [facility])  Result Value Ref Range   Troponin i, poc 0.00 0.00 -  0.08 ng/mL   Comment 3           Dg Chest 2 View  Result Date: 04/09/2017 CLINICAL DATA:  Chest pain. EXAM: CHEST  2 VIEW COMPARISON:  None. FINDINGS: Mild pectus deformity. Mediastinum and hilar structures are normal. Mild left base subsegmental atelectasis. No pleural effusion or pneumothorax. Mild thoracic spine scoliosis concave right. No acute bony abnormality. IMPRESSION: Mild left base subsegmental atelectasis, otherwise normal chest. Electronically Signed   By: Maisie Fus  Register   On: 04/09/2017 09:01    Initial Impression / Assessment and Plan / ED Course  I have reviewed the triage vital signs and the nursing notes.  Pertinent labs & imaging results that were available during my care of the patient were reviewed by me and considered in my medical decision making (see chart for details).     12:20 PM patient asymptomatic. Heart score equals 2. Low risk for acute coronary syndrome. Low pretest clinical suspicion for pulmonary embolism. Negative d-dimer. Plan follow-up with PMD Final Clinical Impressions(s) / ED Diagnoses  Diagnosis atypical chest pain Final diagnoses:  None    New Prescriptions New Prescriptions   No medications on file     Doug Sou, MD 04/09/17 1229

## 2017-04-09 NOTE — ED Notes (Signed)
Pt ambulated to the restroom well with no problems

## 2017-11-17 ENCOUNTER — Other Ambulatory Visit: Payer: Self-pay | Admitting: Obstetrics and Gynecology

## 2017-11-17 DIAGNOSIS — Z1231 Encounter for screening mammogram for malignant neoplasm of breast: Secondary | ICD-10-CM

## 2017-12-11 ENCOUNTER — Ambulatory Visit
Admission: RE | Admit: 2017-12-11 | Discharge: 2017-12-11 | Disposition: A | Discharge status: Home / Self Care | Payer: BLUE CROSS/BLUE SHIELD | Source: Ambulatory Visit | Attending: Obstetrics and Gynecology | Admitting: Obstetrics and Gynecology

## 2017-12-11 DIAGNOSIS — Z1231 Encounter for screening mammogram for malignant neoplasm of breast: Secondary | ICD-10-CM

## 2018-07-13 ENCOUNTER — Encounter: Payer: Self-pay | Admitting: Gastroenterology

## 2018-07-15 ENCOUNTER — Ambulatory Visit: Payer: BLUE CROSS/BLUE SHIELD | Admitting: Gastroenterology

## 2018-08-14 ENCOUNTER — Ambulatory Visit: Payer: BLUE CROSS/BLUE SHIELD | Admitting: Gastroenterology

## 2018-09-10 ENCOUNTER — Encounter: Payer: Self-pay | Admitting: Gastroenterology

## 2018-09-10 ENCOUNTER — Ambulatory Visit (INDEPENDENT_AMBULATORY_CARE_PROVIDER_SITE_OTHER): Payer: BLUE CROSS/BLUE SHIELD | Admitting: Gastroenterology

## 2018-09-10 VITALS — BP 128/80 | HR 81 | Ht 68.0 in | Wt 200.2 lb

## 2018-09-10 DIAGNOSIS — Z8379 Family history of other diseases of the digestive system: Secondary | ICD-10-CM

## 2018-09-10 NOTE — Progress Notes (Addendum)
[facility] Consult Note:  History: Brandi Hampton 09/10/2018  Referring physician: Baldo AshMartin, Michael, FNP  Reason for consult/chief complaint: Dicuss Colonoscopy (Some abdominal pain time to time. takes probiotics. Says she see a difference with it. [facility] )   Subjective  HPI:  This is a very pleasant 48 year old healthy woman referred by primary care for consideration of early colon cancer screening.  Her mother had Crohn's disease diagnosed in her 940s, and there is a maternal great aunt with this condition as well.  No family history of colorectal cancer.  [name] feels well without chronic abdominal pain or rectal bleeding.  She will occasionally have loose stool with certain things such as fried food.  She denies dysphagia, odynophagia, nausea, vomiting, early satiety or weight loss.  Her appetite is good.  At a recent gynecologic visit they suggested she consider an early colonoscopy due to her mother's history.   ROS:  Review of Systems She denies chest pain dyspnea.  She has dysuria recovering from her recent UTI.  Past Medical History: Past Medical History:  Diagnosis Date   Asthma    Headache    Hypothyroidism    Thyroid disease    UTI (urinary tract infection)      Past Surgical History: Past Surgical History:  Procedure Laterality Date   ABDOMINAL HYSTERECTOMY  2006     Family History: Family History  Problem Relation Age of Onset   Breast cancer Cousin    Hypothyroidism Mother    Crohn's disease Mother    Lung cancer Father    Uterine cancer Maternal Grandmother    Diabetes Mellitus I Paternal Grandmother    Lung cancer Paternal Grandfather    Crohn's disease Other        maternal great aunt per patient    Colon cancer Neg Hx    Esophageal cancer Neg Hx     Social History: Social History   Socioeconomic History   Marital status: Married    Spouse name: Not on file   Number of children: 2    Years of education: Not on file   Highest education level: Not on file  Occupational History   Not on file  Social Needs   Financial resource strain: Not on file   Food insecurity:    Worry: Not on file    Inability: Not on file   Transportation needs:    Medical: Not on file    Non-medical: Not on file  Tobacco Use   Smoking status: Never Smoker   Smokeless tobacco: Never Used  Substance and Sexual Activity   Alcohol use: Yes   Drug use: Never   Sexual activity: Yes    Partners: Male    Birth control/protection: Surgical  Lifestyle   Physical activity:    Days per week: Not on file    Minutes per session: Not on file   Stress: Not on file  Relationships   Social connections:    Talks on phone: Not on file    Gets together: Not on file    Attends religious service: Not on file    Active member of club or organization: Not on file    Attends meetings of clubs or organizations: Not on file    Relationship status: Not on file  Other Topics Concern   Not on file  Social History Narrative   Not on file    Allergies: Allergies  Allergen Reactions   Clindamycin Other (  See Comments)    Bloody stool   Eggs Or Egg-Derived Products Swelling   Sulfamethoxazole-Trimethoprim Itching    Outpatient Meds: Current Outpatient Medications  Medication Sig Dispense Refill   AMBULATORY NON FORMULARY MEDICATION 1 tablet daily. Vitamin d-3 & k-2     buPROPion (WELLBUTRIN XL) 150 MG 24 hr tablet Take 150 mg by mouth daily.     estradiol (VIVELLE-DOT) 0.05 MG/24HR patch Place 1 patch onto the skin every 3 (three) days.  2   Probiotic Product (PROBIOTIC-10 PO) Take 1 tablet by mouth daily.     thyroid (ARMOUR) 30 MG tablet Take 30 mg by mouth daily.     EPINEPHrine 0.3 mg/0.3 mL IJ SOAJ injection Inject 0.3 mg into the muscle once as needed. Allergic reaction     No current facility-administered medications for this visit.        ___________________________________________________________________ Objective   Exam:  BP 128/80   Pulse 81   Ht 5\' 8"  (1.727 m)   Wt 200 lb 4 oz (90.8 kg)   BMI 30.45 kg/m   General: this is a(n) well-appearing woman Eyes: sclera anicteric, no redness ENT: oral mucosa moist without lesions, no cervical or supraclavicular lymphadenopathy CV: RRR without murmur, S1/S2, no JVD, no peripheral edema Resp: clear to auscultation bilaterally, normal RR and effort noted GI: soft, mild suprapubic tenderness, with active bowel sounds. No guarding or palpable organomegaly noted. Skin; warm and dry, no rash or jaundice noted Neuro: awake, alert and oriented x 3. Normal gross motor function and fluent speech  No data for review  Assessment: Encounter Diagnosis  Name Primary?   Family history of Crohn's disease Yes    She is still at average risk for colorectal cancer and can be screened at age 76.  We will send her a recall letter at the appropriate time.  Thank you for the courtesy of this consult.  Please call me with any questions or concerns.  Charlie Pitter III  CC: Referring provider noted above

## 2018-09-10 NOTE — Patient Instructions (Signed)
You will be due for a recall colonoscopy in 10/2022. We will send you a reminder in the mail when it gets closer to that time.  Thank you for choosing me and [facility].  Amada Jupiter, MD

## 2018-09-28 ENCOUNTER — Telehealth: Payer: Self-pay

## 2018-09-28 NOTE — Telephone Encounter (Signed)
SENT REFERRAL TO SCHEDULING AND FILE NOTES 

## 2018-10-01 DIAGNOSIS — R9431 Abnormal electrocardiogram [ECG] [EKG]: Secondary | ICD-10-CM

## 2018-10-01 HISTORY — DX: Abnormal electrocardiogram (ECG) (EKG): R94.31

## 2018-10-01 NOTE — Progress Notes (Signed)
Cardiology Office Note:    Date:  10/02/2018   ID:  Brandi Hampton, DOB 31-Aug-1971, MRN [id number]  PCP:  Baldo Ash, FNP  Cardiologist:  Norman Herrlich, MD   Referring MD: Baldo Ash, FNP  ASSESSMENT:    1. Abnormal EKG   2. Acquired hypothyroidism    PLAN:    In order of problems listed above:  1. Her EKG today is within normal limits she has no cardiovascular symptoms or abnormalities on exam and I do not think at this time she requires further evaluation I discussed with her the dynamic nature of EKGs being affected by respiratory rate posture body temperature and even before and after carbohydrate meals unfortunately my training program EKG compensate was a big part of what we did and I feel very comfortable understanding the variance of normal. 2. Stable continue her current thyroid supplement  Next appointment will be as needed   Medication Adjustments/Labs and Tests Ordered: Current medicines are reviewed at length with the patient today.  Concerns regarding medicines are outlined above.  Orders Placed This Encounter  Procedures  . EKG 12-Lead   No orders of the defined types were placed in this encounter.    No chief complaint on file.   History of Present Illness:    Brandi Hampton is a 48 y.o. female who is being seen today for the evaluation of an abnormal EKG at the request of Baldo Ash, FNP.  She was seen in [facility] emergency room 04/09/2018 with chest pain, d-dimer and troponin were normal EKG normal chest x-ray showed mild left base atelectasis.  She had a wellness exam abnormal EKG referred no history of heart disease she has had no cardiovascular symptoms of chest pain shortness of breath palpitation or syncope.  Only medical illnesses hypothyroidism her EKG in the office today is within normal limits her physical examination is normal and we discussed whether she requires further cardiac testing.  She her concern is to start  an exercise program and I do not think that she needs either have a stress test or cardiac echo at this time she is comfortable with this approach and I will see him back in the office as needed.  We discussed weight loss as a slow gradual process compliance with reducing calories as well as activity of 20 to 30 minutes/day is the keys to success.  She seems quite motivated and I think she will succeed Past Medical History:  Diagnosis Date  . Abnormal EKG 10/01/2018  . Adjustment disorder with depressed mood 10/02/2018  . Asthma   . Cervical intraepithelial neoplasia grade III with severe dysplasia 02/08/1992  . Depressive disorder 10/02/2018  . Fatigue 10/02/2018  . Female stress incontinence 10/02/2018  . Headache   . Hypothyroidism   . Thyroid disease   . UTI (urinary tract infection)   . Weight gain 10/02/2018    Past Surgical History:  Procedure Laterality Date  . ABDOMINAL HYSTERECTOMY  2006    Current Medications: Current Meds  Medication Sig  . AMBULATORY NON FORMULARY MEDICATION 1 tablet daily. Vitamin d-3 & k-2  . buPROPion (WELLBUTRIN XL) 150 MG 24 hr tablet Take 150 mg by mouth daily.  Marland Kitchen EPINEPHrine 0.3 mg/0.3 mL IJ SOAJ injection Inject 0.3 mg into the muscle once as needed. Allergic reaction  . estradiol (VIVELLE-DOT) 0.05 MG/24HR patch Place 1 patch onto the skin every 3 (three) days.  . Probiotic Product (PROBIOTIC-10 PO) Take 1 tablet by mouth daily.  Marland Kitchen  thyroid (ARMOUR) 30 MG tablet Take 30 mg by mouth daily.     Allergies:   Clindamycin; Eggs or egg-derived products; and Sulfamethoxazole-trimethoprim   Social History   Socioeconomic History  . Marital status: Married    Spouse name: Not on file  . Number of children: 2  . Years of education: Not on file  . Highest education level: Not on file  Occupational History  . Not on file  Social Needs  . Financial resource strain: Not on file  . Food insecurity:    Worry: Not on file    Inability: Not on file    . Transportation needs:    Medical: Not on file    Non-medical: Not on file  Tobacco Use  . Smoking status: Never Smoker  . Smokeless tobacco: Never Used  Substance and Sexual Activity  . Alcohol use: Yes    Comment: occ  . Drug use: Never  . Sexual activity: Yes    Partners: Male    Birth control/protection: Surgical  Lifestyle  . Physical activity:    Days per week: Not on file    Minutes per session: Not on file  . Stress: Not on file  Relationships  . Social connections:    Talks on phone: Not on file    Gets together: Not on file    Attends religious service: Not on file    Active member of club or organization: Not on file    Attends meetings of clubs or organizations: Not on file    Relationship status: Not on file  Other Topics Concern  . Not on file  Social History Narrative  . Not on file     Family History: The patient's family history includes Breast cancer in her cousin; Crohn's disease in her mother and another family member; Diabetes Mellitus I in her paternal grandmother; Hypothyroidism in her mother; Lung cancer in her father and paternal grandfather; Uterine cancer in her maternal grandmother. There is no history of Colon cancer, Esophageal cancer, or Heart attack.  ROS:   ROS Please see the history of present illness.     All other systems reviewed and are negative.  EKGs/Labs/Other Studies Reviewed:    The following studies were reviewed today:   EKG:  EKG is  ordered today.  The ekg ordered today demonstrates SRTH normal  Recent Labs: No results found for requested labs within last 8760 hours.  Recent Lipid Panel No results found for: CHOL, TRIG, HDL, CHOLHDL, VLDL, LDLCALC, LDLDIRECT  Physical Exam:    VS:  BP 132/84 (BP Location: Left Arm, Patient Position: Sitting, Cuff Size: Normal)   Pulse 89   Ht 5\' 8"  (1.727 m)   Wt 200 lb 12.8 oz (91.1 kg)   SpO2 98%   BMI 30.53 kg/m     Wt Readings from Last 3 Encounters:  10/02/18 200 lb  12.8 oz (91.1 kg)  09/10/18 200 lb 4 oz (90.8 kg)  04/09/17 170 lb (77.1 kg)     GEN:  Well nourished, well developed in no acute distress HEENT: Normal NECK: No JVD; No carotid bruits LYMPHATICS: No lymphadenopathy CARDIAC: RRR, no murmurs, rubs, gallops RESPIRATORY:  Clear to auscultation without rales, wheezing or rhonchi  ABDOMEN: Soft, non-tender, non-distended MUSCULOSKELETAL:  No edema; No deformity  SKIN: Warm and dry NEUROLOGIC:  Alert and oriented x 3 PSYCHIATRIC:  Normal affect     Signed, Norman Herrlich, MD  10/02/2018 9:57 AM    [facility]

## 2018-10-02 ENCOUNTER — Ambulatory Visit (INDEPENDENT_AMBULATORY_CARE_PROVIDER_SITE_OTHER): Payer: BLUE CROSS/BLUE SHIELD | Admitting: Cardiology

## 2018-10-02 VITALS — BP 132/84 | HR 89 | Ht 68.0 in | Wt 200.8 lb

## 2018-10-02 DIAGNOSIS — F32A Depression, unspecified: Secondary | ICD-10-CM

## 2018-10-02 DIAGNOSIS — R5383 Other fatigue: Secondary | ICD-10-CM

## 2018-10-02 DIAGNOSIS — E039 Hypothyroidism, unspecified: Secondary | ICD-10-CM

## 2018-10-02 DIAGNOSIS — F329 Major depressive disorder, single episode, unspecified: Secondary | ICD-10-CM

## 2018-10-02 DIAGNOSIS — F4321 Adjustment disorder with depressed mood: Secondary | ICD-10-CM

## 2018-10-02 DIAGNOSIS — R635 Abnormal weight gain: Secondary | ICD-10-CM | POA: Insufficient documentation

## 2018-10-02 DIAGNOSIS — N393 Stress incontinence (female) (male): Secondary | ICD-10-CM | POA: Insufficient documentation

## 2018-10-02 DIAGNOSIS — R9431 Abnormal electrocardiogram [ECG] [EKG]: Secondary | ICD-10-CM | POA: Diagnosis not present

## 2018-10-02 HISTORY — DX: Abnormal weight gain: R63.5

## 2018-10-02 HISTORY — DX: Stress incontinence (female) (male): N39.3

## 2018-10-02 HISTORY — DX: Other fatigue: R53.83

## 2018-10-02 HISTORY — DX: Adjustment disorder with depressed mood: F43.21

## 2018-10-02 HISTORY — DX: Major depressive disorder, single episode, unspecified: F32.9

## 2018-10-02 HISTORY — DX: Depression, unspecified: F32.A

## 2018-10-02 NOTE — Patient Instructions (Addendum)
Medication Instructions:  Your physician recommends that you continue on your current medications as directed. Please refer to the Current Medication list given to you today.  If you need a refill on your cardiac medications before your next appointment, please call your pharmacy.   Lab work: None  If you have labs (blood work) drawn today and your tests are completely normal, you will receive your results only by: Marland Kitchen MyChart Message (if you have MyChart) OR . A paper copy in the mail If you have any lab test that is abnormal or we need to change your treatment, we will call you to review the results.  Testing/Procedures: You had an EKG today.  Follow-Up: At [facility], you and your health needs are our priority.  As part of our continuing mission to provide you with exceptional heart care, we have created designated Provider Care Teams.  These Care Teams include your primary Cardiologist (physician) and Advanced Practice Providers (APPs -  Physician Assistants and Nurse Practitioners) who all work together to provide you with the care you need, when you need it. You will need a follow up appointment as needed if symptoms worsen or fail to improve.       Exercising to Lose Weight Exercise is structured, repetitive physical activity to improve fitness and health. Getting regular exercise is important for everyone. It is especially important if you are overweight. Being overweight increases your risk of heart disease, stroke, diabetes, high blood pressure, and several types of cancer. Reducing your calorie intake and exercising can help you lose weight. Exercise is usually categorized as moderate or vigorous intensity. To lose weight, most people need to do a certain amount of moderate-intensity or vigorous-intensity exercise each week. Moderate-intensity exercise  Moderate-intensity exercise is any activity that gets you moving enough to burn at least three times more energy (calories)  than if you were sitting. Examples of moderate exercise include:  Walking a mile in 15 minutes.  Doing light yard work.  Biking at an easy pace. Most people should get at least 150 minutes (2 hours and 30 minutes) a week of moderate-intensity exercise to maintain their body weight. Vigorous-intensity exercise Vigorous-intensity exercise is any activity that gets you moving enough to burn at least six times more calories than if you were sitting. When you exercise at this intensity, you should be working hard enough that you are not able to carry on a conversation. Examples of vigorous exercise include:  Running.  Playing a team sport, such as football, basketball, and soccer.  Jumping rope. Most people should get at least 75 minutes (1 hour and 15 minutes) a week of vigorous-intensity exercise to maintain their body weight. How can exercise affect me? When you exercise enough to burn more calories than you eat, you lose weight. Exercise also reduces body fat and builds muscle. The more muscle you have, the more calories you burn. Exercise also:  Improves mood.  Reduces stress and tension.  Improves your overall fitness, flexibility, and endurance.  Increases bone strength. The amount of exercise you need to lose weight depends on:  Your age.  The type of exercise.  Any health conditions you have.  Your overall physical ability. Talk to your health care provider about how much exercise you need and what types of activities are safe for you. What actions can I take to lose weight? Nutrition   Make changes to your diet as told by your health care provider or diet and nutrition specialist (dietitian).  This may include: ? Eating fewer calories. ? Eating more protein. ? Eating less unhealthy fats. ? Eating a diet that includes fresh fruits and vegetables, whole grains, low-fat dairy products, and lean protein. ? Avoiding foods with added fat, salt, and sugar.  Drink plenty  of water while you exercise to prevent dehydration or heat stroke. Activity  Choose an activity that you enjoy and set realistic goals. Your health care provider can help you make an exercise plan that works for you.  Exercise at a moderate or vigorous intensity most days of the week. ? The intensity of exercise may vary from person to person. You can tell how intense a workout is for you by paying attention to your breathing and heartbeat. Most people will notice their breathing and heartbeat get faster with more intense exercise.  Do resistance training twice each week, such as: ? Push-ups. ? Sit-ups. ? Lifting weights. ? Using resistance bands.  Getting short amounts of exercise can be just as helpful as long structured periods of exercise. If you have trouble finding time to exercise, try to include exercise in your daily routine. ? Get up, stretch, and walk around every 30 minutes throughout the day. ? Go for a walk during your lunch break. ? Park your car farther away from your destination. ? If you take public transportation, get off one stop early and walk the rest of the way. ? Make phone calls while standing up and walking around. ? Take the stairs instead of elevators or escalators.  Wear comfortable clothes and shoes with good support.  Do not exercise so much that you hurt yourself, feel dizzy, or get very short of breath. Where to find more information  U.[place]. Department of Health and Human Services: ThisPath.fi  Centers for Disease Control and Prevention (CDC): FootballExhibition.com.br Contact a health care provider:  Before starting a new exercise program.  If you have questions or concerns about your weight.  If you have a medical problem that keeps you from exercising. Get help right away if you have any of the following while exercising:  Injury.  Dizziness.  Difficulty breathing or shortness of breath that does not go away when you stop exercising.  Chest  pain.  Rapid heartbeat. Summary  Being overweight increases your risk of heart disease, stroke, diabetes, high blood pressure, and several types of cancer.  Losing weight happens when you burn more calories than you eat.  Reducing the amount of calories you eat in addition to getting regular moderate or vigorous exercise each week helps you lose weight. This information is not intended to replace advice given to you by your health care provider. Make sure you discuss any questions you have with your health care provider. Document Released: 09/28/2010 Document Revised: 09/08/2017 Document Reviewed: 09/08/2017 Elsevier Interactive Patient Education  2019 [employer].

## 2019-01-15 ENCOUNTER — Other Ambulatory Visit: Payer: Self-pay | Admitting: Obstetrics and Gynecology

## 2019-01-15 DIAGNOSIS — Z1231 Encounter for screening mammogram for malignant neoplasm of breast: Secondary | ICD-10-CM

## 2019-03-19 ENCOUNTER — Ambulatory Visit: Payer: BLUE CROSS/BLUE SHIELD

## 2020-05-24 ENCOUNTER — Other Ambulatory Visit: Payer: Self-pay

## 2020-05-24 ENCOUNTER — Encounter (INDEPENDENT_AMBULATORY_CARE_PROVIDER_SITE_OTHER): Payer: Self-pay | Admitting: Otolaryngology

## 2020-05-24 ENCOUNTER — Ambulatory Visit (INDEPENDENT_AMBULATORY_CARE_PROVIDER_SITE_OTHER): Payer: 59 | Admitting: Otolaryngology

## 2020-05-24 VITALS — Temp 97.7°F

## 2020-05-24 DIAGNOSIS — H60312 Diffuse otitis externa, left ear: Secondary | ICD-10-CM | POA: Diagnosis not present

## 2020-05-24 DIAGNOSIS — R42 Dizziness and giddiness: Secondary | ICD-10-CM | POA: Diagnosis not present

## 2020-05-24 NOTE — Progress Notes (Addendum)
HPI: Brandi Hampton is a 49 y.o. female who presents for evaluation of recurrent left external otitis.  Apparently she has had 3 previous infections of the left ear canal in April June and is been hurting for the past week or 2.  She is also has some episodes of dizziness.  She has been treated with 2 rounds of antibiotics including Z-Pak and Augmentin. 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 She denies excessive swimming or getting a lot of water in her ears.  She does not use ear buds or earplugs.  Past Medical History:  Diagnosis Date  . Abnormal EKG 10/01/2018  . Adjustment disorder with depressed mood 10/02/2018  . Asthma   . Cervical intraepithelial neoplasia grade III with severe dysplasia 02/08/1992  . Depressive disorder 10/02/2018  . Fatigue 10/02/2018  . Female stress incontinence 10/02/2018  . Headache   . Hypothyroidism   . Thyroid disease   . UTI (urinary tract infection)   . Weight gain 10/02/2018   Past Surgical History:  Procedure Laterality Date  . ABDOMINAL HYSTERECTOMY  2006   Social History   Socioeconomic History  . Marital status: Married    Spouse name: Not on file  . Number of children: 2  . Years of education: Not on file  . Highest education level: Not on file  Occupational History  . Not on file  Tobacco Use  . Smoking status: Never Smoker  . Smokeless tobacco: Never Used  Vaping Use  . Vaping Use: Never used  Substance and Sexual Activity  . Alcohol use: Yes    Comment: occ  . Drug use: Never  . Sexual activity: Yes    Partners: Male    Birth control/protection: Surgical  Other Topics Concern  . Not on file  Social History Narrative  . Not on file   Social Determinants of Health   Financial Resource Strain:   . Difficulty of Paying Living Expenses: Not on file  Food Insecurity:   . Worried About Programme researcher, broadcasting/film/video in the Last Year: Not on file  . Ran Out of Food in the Last Year: Not on file  Transportation Needs:   . Lack  of Transportation (Medical): Not on file  . Lack of Transportation (Non-Medical): Not on file  Physical Activity:   . Days of Exercise per Week: Not on file  . Minutes of Exercise per Session: Not on file  Stress:   . Feeling of Stress : Not on file  Social Connections:   . Frequency of Communication with Friends and Family: Not on file  . Frequency of Social Gatherings with Friends and Family: Not on file  . Attends Religious Services: Not on file  . Active Member of Clubs or Organizations: Not on file  . Attends Banker Meetings: Not on file  . Marital Status: Not on file   Family History  Problem Relation Age of Onset  . Breast cancer Cousin   . Hypothyroidism Mother   . Crohn's disease Mother   . Lung cancer Father   . Uterine cancer Maternal Grandmother   . Diabetes Mellitus I Paternal Grandmother   . Lung cancer Paternal Grandfather   . Crohn's disease Other        maternal great aunt per patient   . Colon cancer Neg Hx   . Esophageal cancer Neg Hx   . Heart attack Neg Hx    Allergies  Allergen Reactions  .  Clindamycin Other (See Comments)    Bloody stool  . Eggs Or Egg-Derived Products Swelling  . Sulfamethoxazole-Trimethoprim Itching   Prior to Admission medications   Medication Sig Start Date End Date Taking? Authorizing Provider  AMBULATORY NON FORMULARY MEDICATION 1 tablet daily. Vitamin d-3 & k-2   Yes [provider]  buPROPion (WELLBUTRIN XL) 150 MG 24 hr tablet Take 150 mg by mouth daily.   Yes [provider]  EPINEPHrine 0.3 mg/0.3 mL IJ SOAJ injection Inject 0.3 mg into the muscle once as needed. Allergic reaction   Yes [provider]  estradiol (VIVELLE-DOT) 0.05 MG/24HR patch Place 1 patch onto the skin every 3 (three) days. 04/03/17  Yes [provider]  Probiotic Product (PROBIOTIC-10 PO) Take 1 tablet by mouth daily.   Yes [provider]  thyroid (ARMOUR) 30 MG tablet Take 30 mg by  mouth daily.   Yes [provider]     Positive ROS: Otherwise negative  All other systems have been reviewed and were otherwise negative with the exception of those mentioned in the HPI and as above.  Physical Exam: Constitutional: Alert, well-appearing, no acute distress Ears: External ears without lesions or tenderness.  She has small ear canals bilaterally.  The right ear canal is not tender and the TM is clear.  The left ear canal is much more tender.  She has some debris that I cleaned with suction.  The TM appeared clear.  She had one episode of dizziness but no obvious nystagmus noted.  Applied Ciprodex eardrops to the left ear canal along with a cottonball. Nasal: External nose without lesions. Clear nasal passages Oral: Lips and gums without lesions. Tongue and palate mucosa without lesions. Posterior oropharynx clear. Neck: No palpable adenopathy or masses.  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  She has mild inflammatory changes.Marland Kitchen Respiratory: Breathing comfortably  Skin: No facial/neck lesions or rash noted.  Cerumen impaction removal  Date/Time: 05/24/2020 1:01 PM Performed by: Drema Halon, MD Authorized by: Drema Halon, MD   Consent:    Consent obtained:  Verbal   Consent given by:  Patient   Risks discussed:  Pain and bleeding Procedure details:    Location:  L ear   Procedure type: suction   Post-procedure details:    Inspection:  TM intact and canal normal   Hearing quality:  Improved   Patient tolerance of procedure:  Tolerated well, no immediate complications Comments:     She has small ear canals bilaterally.  She has slight inflammatory changes of left ear canal.  TMs are clear bilaterally.    Assessment: Recurrent left external otitis History of dizziness.  Plan: Placed on Cortisporin otic suspension drops 4 to 5 drops in the left ear 3 times  daily for 5 days and also prescribed Cipro 5 mg twice daily for a week.  Recommend using a cotton ball in the left ear after putting eardrops in the ear and leaving it for 30 to 45 minutes.  Briefly discussed the option of an ear wick but she would rather use the drops without a wick. Gave her some information on BPPV and the Epley maneuver as she has had problems with dizziness and vertigo in the past although I am not sure this is BPPV. Discussed with her that I would expect the ear pain did resolve within 3 to 4 days  and will notify us if she has any persistent or recurrent pain.  Narda Bonds, MD

## 2020-05-30 ENCOUNTER — Ambulatory Visit (INDEPENDENT_AMBULATORY_CARE_PROVIDER_SITE_OTHER): Payer: 59 | Admitting: Otolaryngology

## 2020-09-27 ENCOUNTER — Other Ambulatory Visit: Payer: Self-pay | Admitting: Obstetrics & Gynecology

## 2020-09-27 DIAGNOSIS — Z1231 Encounter for screening mammogram for malignant neoplasm of breast: Secondary | ICD-10-CM

## 2020-12-08 ENCOUNTER — Other Ambulatory Visit: Payer: Self-pay | Admitting: Obstetrics & Gynecology

## 2020-12-08 DIAGNOSIS — N6002 Solitary cyst of left breast: Secondary | ICD-10-CM

## 2020-12-11 ENCOUNTER — Ambulatory Visit: Payer: 59

## 2021-01-19 ENCOUNTER — Ambulatory Visit: Payer: 59

## 2021-01-19 ENCOUNTER — Other Ambulatory Visit: Payer: Self-pay

## 2021-01-19 ENCOUNTER — Ambulatory Visit
Admission: RE | Admit: 2021-01-19 | Discharge: 2021-01-19 | Disposition: A | Discharge status: Home / Self Care | Payer: 59 | Source: Ambulatory Visit | Attending: Obstetrics & Gynecology | Admitting: Obstetrics & Gynecology

## 2021-01-19 ENCOUNTER — Ambulatory Visit
Admission: RE | Admit: 2021-01-19 | Discharge: 2021-01-19 | Disposition: A | Discharge status: Home / Self Care | Payer: BC Managed Care – PPO | Source: Ambulatory Visit | Attending: Obstetrics & Gynecology | Admitting: Obstetrics & Gynecology

## 2021-01-19 DIAGNOSIS — N6002 Solitary cyst of left breast: Secondary | ICD-10-CM

## 2021-08-20 IMAGING — US US BREAST*L* LIMITED INC AXILLA
1 series · 9 of 9 positions shown · non-contrast
Comparison: Previous exam(s).

CLINICAL DATA: 50-year-old female presenting for annual bilateral
mammogram and delayed follow-up of a probably benign left breast
mass evaluated at an outside facility.

EXAM:
DIGITAL DIAGNOSTIC BILATERAL MAMMOGRAM WITH TOMOSYNTHESIS AND CAD;
ULTRASOUND LEFT BREAST LIMITED
TECHNIQUE: Bilateral digital diagnostic mammography and breast tomosynthesis
was performed. The images were evaluated with computer-aided
detection.; Targeted ultrasound examination of the left breast was
performed

[Series 1: us breast*left* limited inc axilla · 0.07mm/px · 9 of 9 slices shown]
[im 1/9]
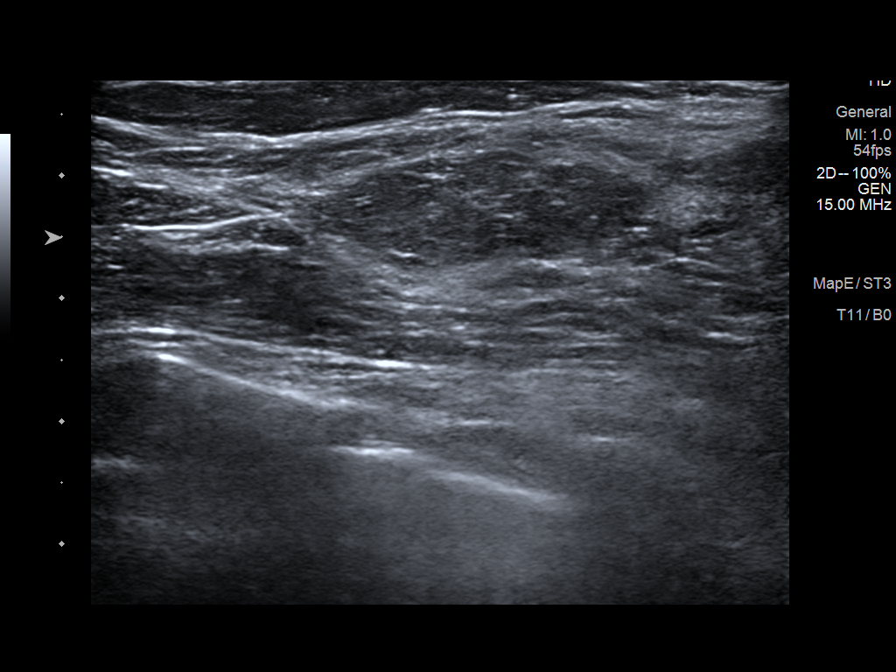
[im 2/9]
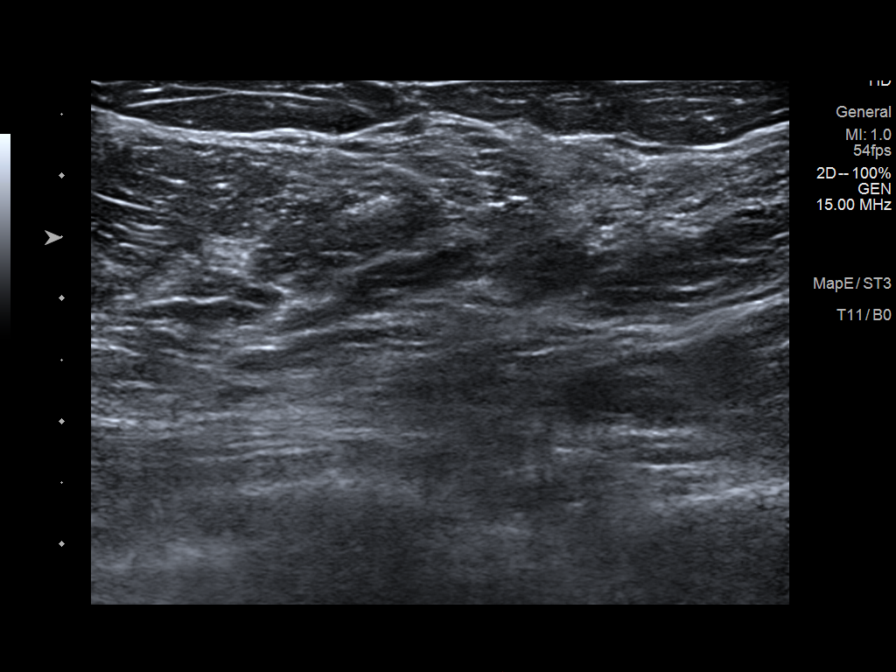
[im 3/9]
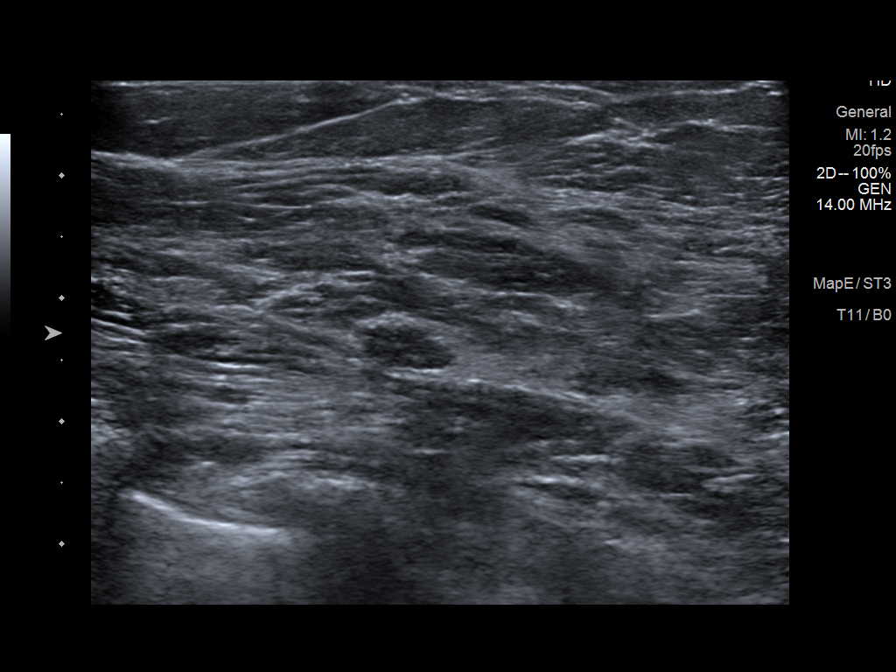
[im 4/9]
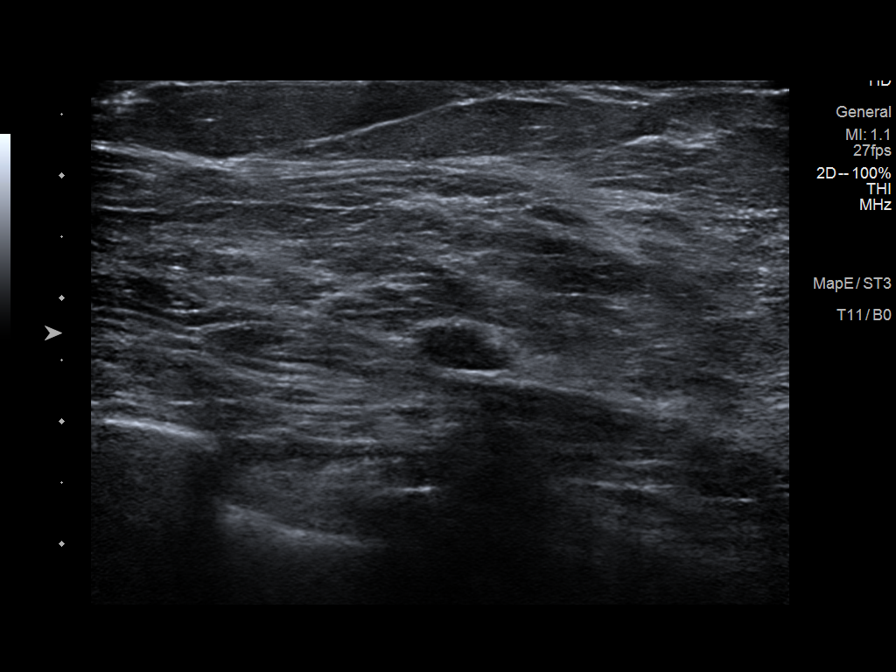
[im 5/9]
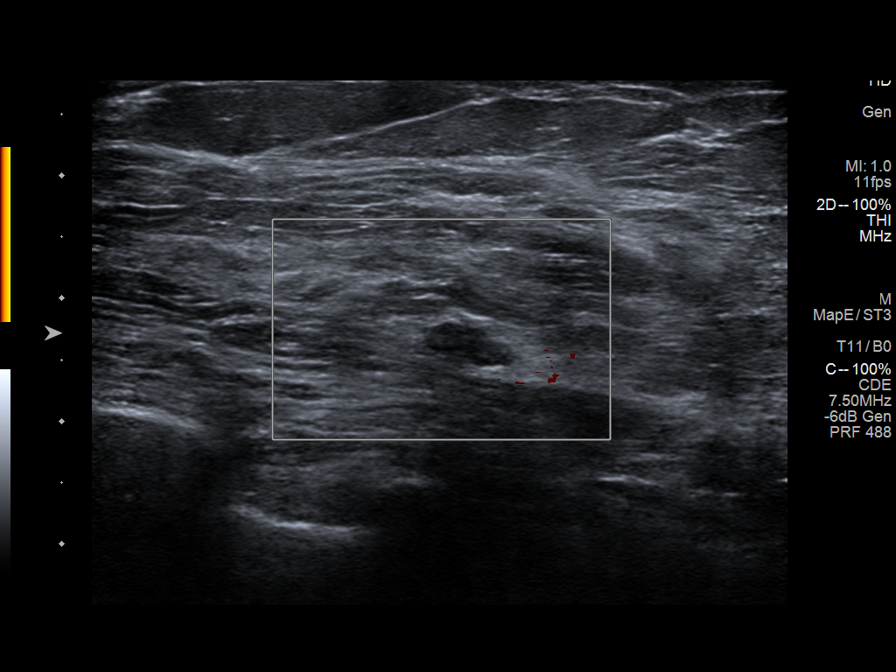
[im 6/9]
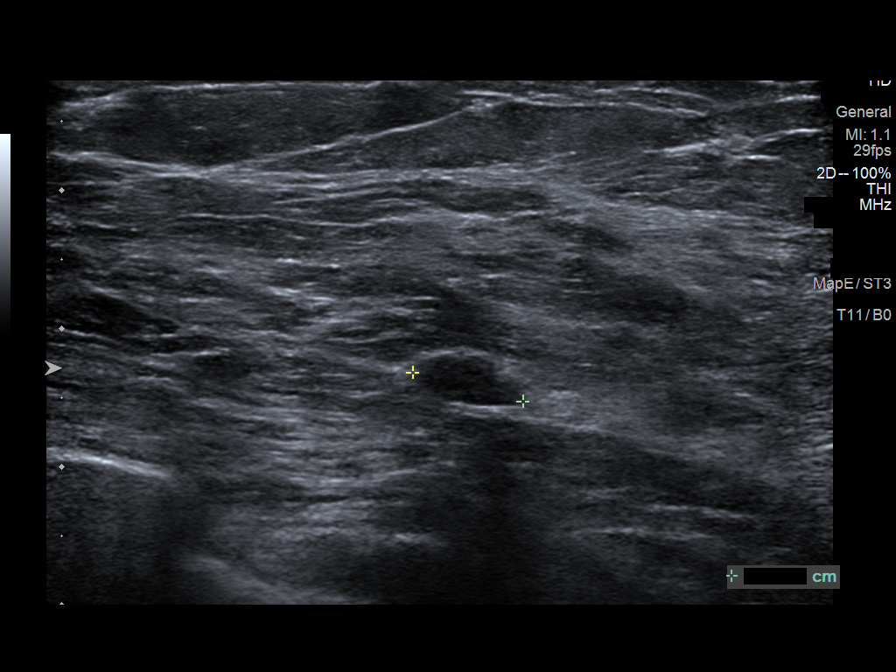
[im 7/9]
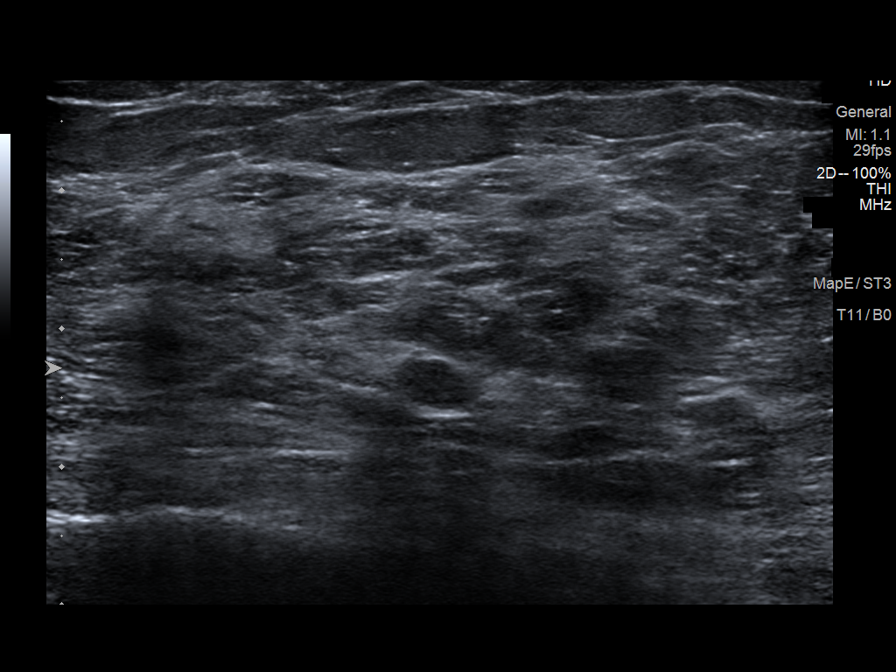
[im 8/9]
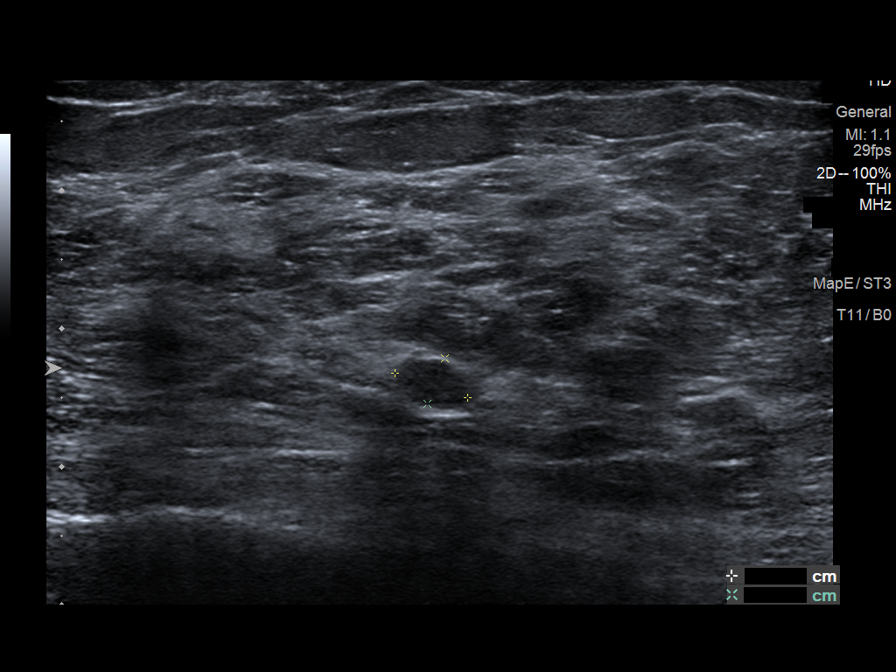
[im 9/9]
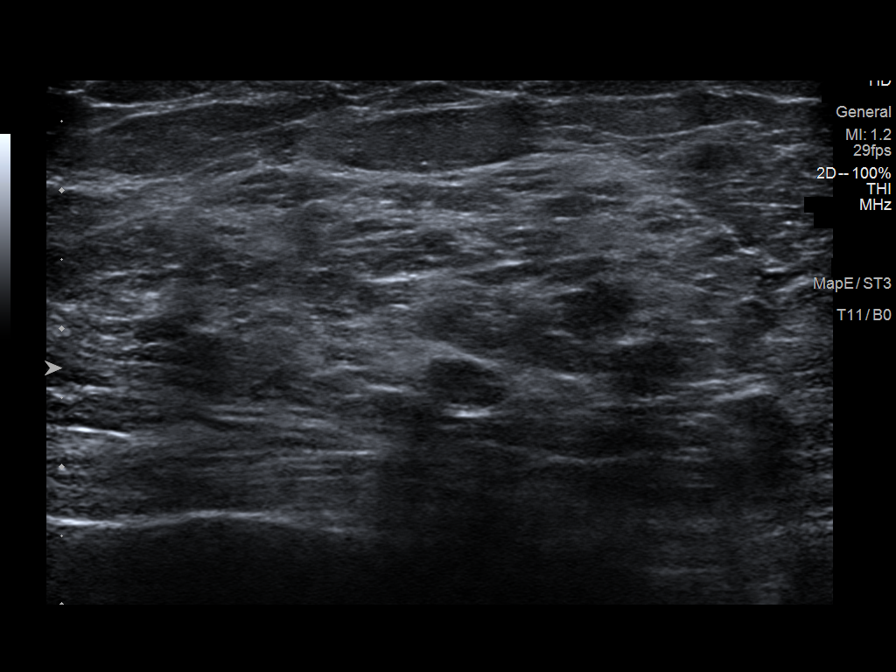

[9 of 9 positions shown; findings below may reference images not displayed]

ACR Breast Density Category c: The breast tissue is heterogeneously
dense, which may obscure small masses.
FINDINGS: An oval, circumscribed equal density mass in the medial left breast
at posterior depth is less conspicuous on today's views. Possible
asymmetry in the slightly lateral aspect posteriorly as seen on the
cc projection resolves on additional spot views. Otherwise, no new
or suspicious findings in either breast.

Targeted ultrasound is performed, showing stable appearance of an
oval, circumscribed hypoechoic mass at the deep 10 o'clock position
10 cm from the nipple. It measures 8 x 6 x 4 mm (previously 8 x 7 x
3 mm).
IMPRESSION: 1. Stable, probably benign left breast mass. Recommend a final
ultrasound follow-up in 1 year to coincide with the patient's annual
bilateral mammogram.
2. No mammographic evidence of malignancy on the right.

RECOMMENDATION:
Bilateral diagnostic mammogram and left breast ultrasound in 1 year.

I have discussed the findings and recommendations with the patient.
If applicable, a reminder letter will be sent to the patient
regarding the next appointment.

BI-RADS CATEGORY  3: Probably benign.

## 2021-08-20 IMAGING — MG DIGITAL DIAGNOSTIC BILAT W/ TOMO W/ CAD
6 of 12 series · 6 of 36 positions shown · non-contrast
Comparison: Previous exam(s).

CLINICAL DATA: 50-year-old female presenting for annual bilateral
mammogram and delayed follow-up of a probably benign left breast
mass evaluated at an outside facility.

EXAM:
DIGITAL DIAGNOSTIC BILATERAL MAMMOGRAM WITH TOMOSYNTHESIS AND CAD;
ULTRASOUND LEFT BREAST LIMITED
TECHNIQUE: Bilateral digital diagnostic mammography and breast tomosynthesis
was performed. The images were evaluated with computer-aided
detection.; Targeted ultrasound examination of the left breast was
performed

[L CC synth-2D (1 of 2)]
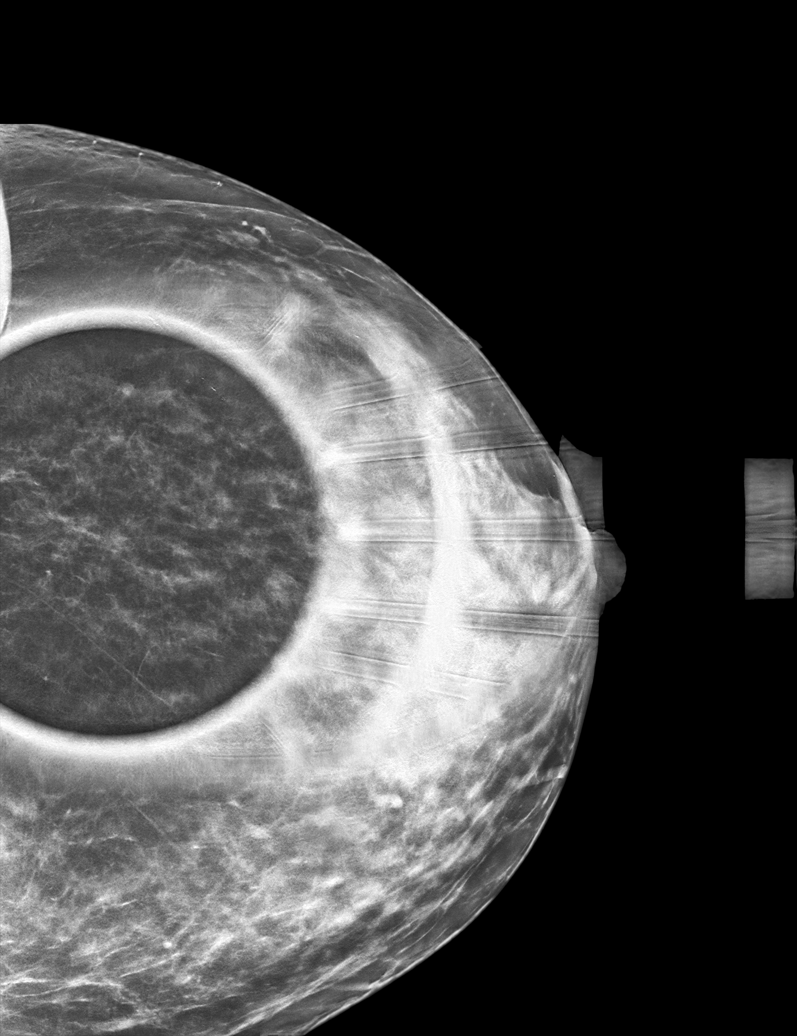

[L MLO synth-2D]
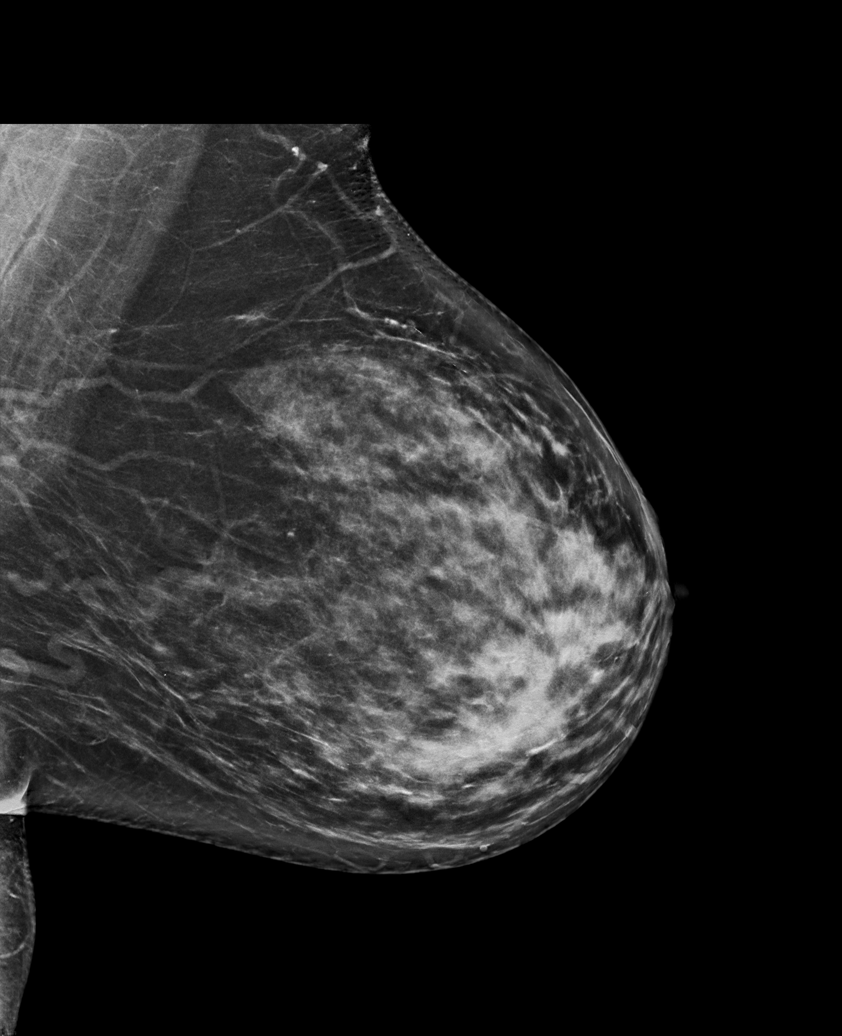

[L CC synth-2D (2 of 2)]
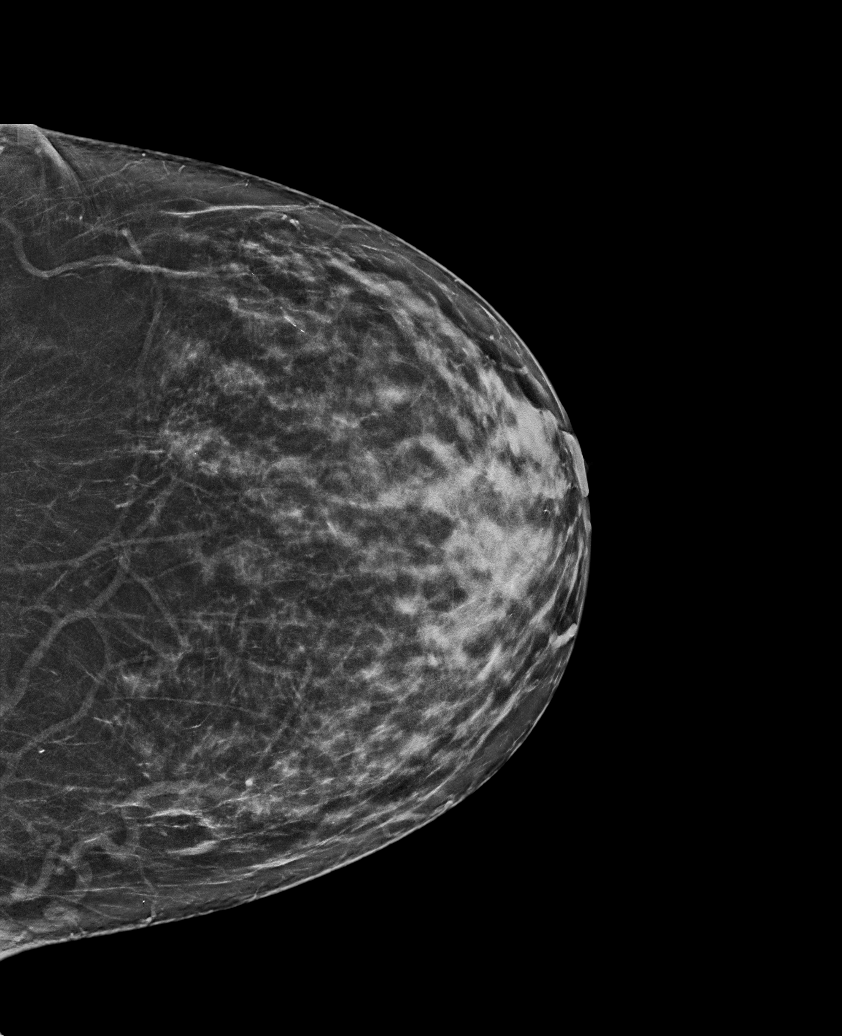

[R MLO synth-2D]
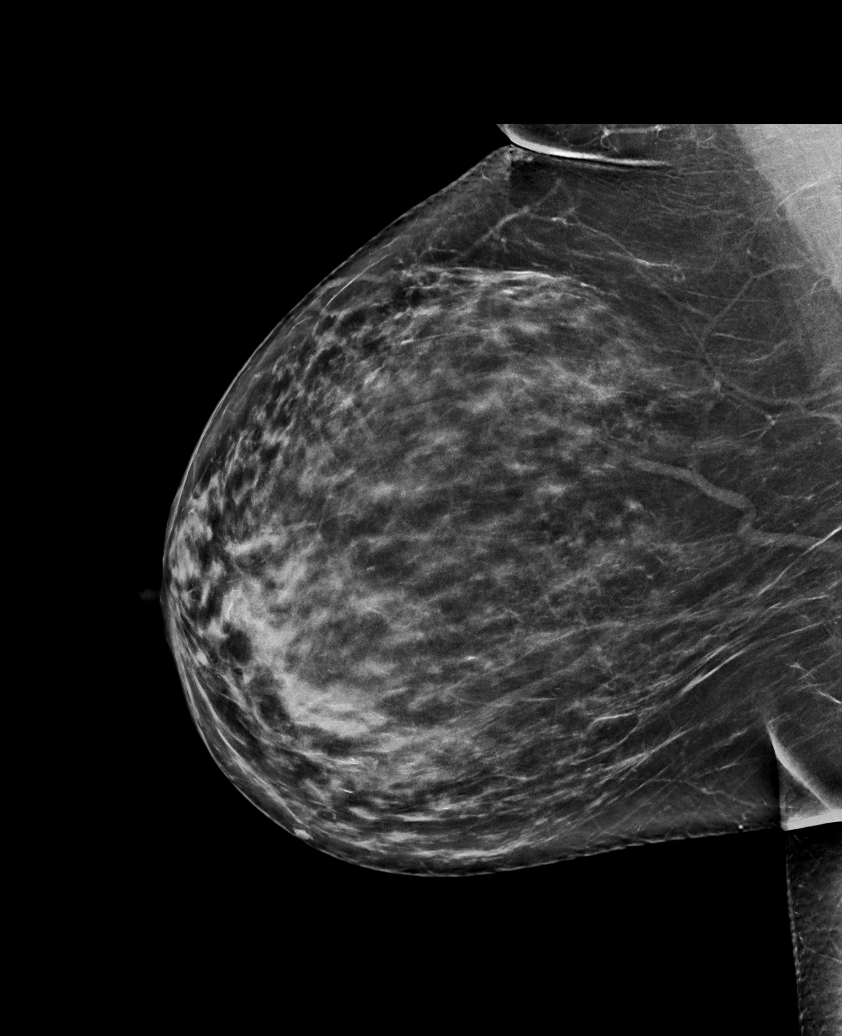

[L ML synth-2D]
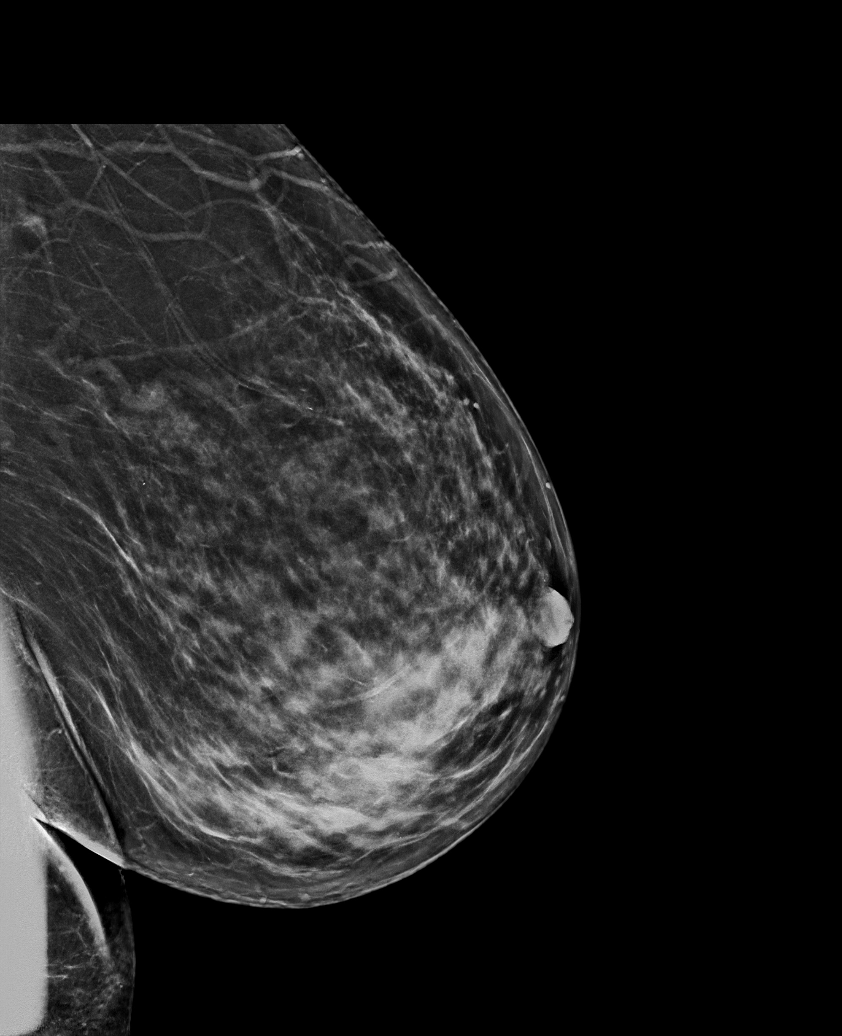

[R CC synth-2D]
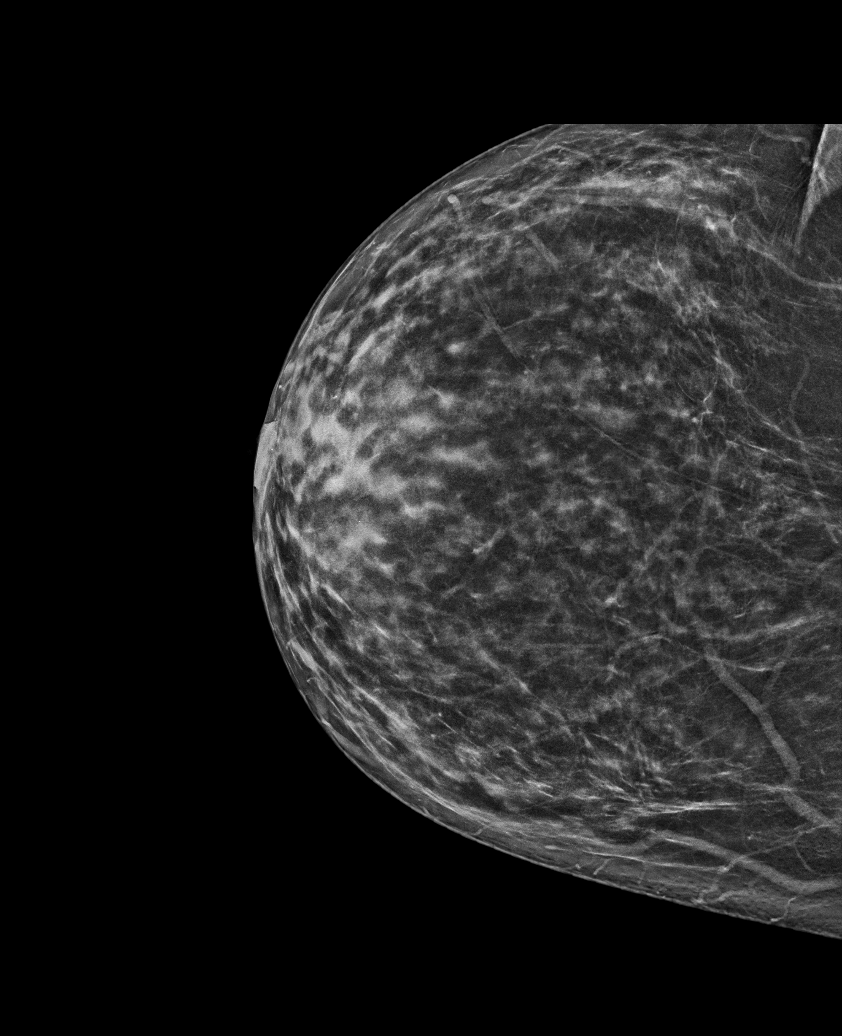

[6 of 36 positions shown; findings below may reference images not displayed]

ACR Breast Density Category c: The breast tissue is heterogeneously
dense, which may obscure small masses.
FINDINGS: An oval, circumscribed equal density mass in the medial left breast
at posterior depth is less conspicuous on today's views. Possible
asymmetry in the slightly lateral aspect posteriorly as seen on the
cc projection resolves on additional spot views. Otherwise, no new
or suspicious findings in either breast.

Targeted ultrasound is performed, showing stable appearance of an
oval, circumscribed hypoechoic mass at the deep 10 o'clock position
10 cm from the nipple. It measures 8 x 6 x 4 mm (previously 8 x 7 x
3 mm).
IMPRESSION: 1. Stable, probably benign left breast mass. Recommend a final
ultrasound follow-up in 1 year to coincide with the patient's annual
bilateral mammogram.
2. No mammographic evidence of malignancy on the right.

RECOMMENDATION:
Bilateral diagnostic mammogram and left breast ultrasound in 1 year.

I have discussed the findings and recommendations with the patient.
If applicable, a reminder letter will be sent to the patient
regarding the next appointment.

BI-RADS CATEGORY  3: Probably benign.

## 2022-06-17 ENCOUNTER — Other Ambulatory Visit: Payer: Self-pay | Admitting: Obstetrics & Gynecology

## 2022-06-17 DIAGNOSIS — Z1231 Encounter for screening mammogram for malignant neoplasm of breast: Secondary | ICD-10-CM

## 2022-07-19 DIAGNOSIS — Z1231 Encounter for screening mammogram for malignant neoplasm of breast: Secondary | ICD-10-CM

## 2022-08-06 ENCOUNTER — Other Ambulatory Visit: Payer: Self-pay | Admitting: Obstetrics & Gynecology

## 2022-08-06 DIAGNOSIS — R928 Other abnormal and inconclusive findings on diagnostic imaging of breast: Secondary | ICD-10-CM

## 2022-09-10 ENCOUNTER — Other Ambulatory Visit: Payer: Self-pay | Admitting: Obstetrics & Gynecology

## 2022-09-10 DIAGNOSIS — N6320 Unspecified lump in the left breast, unspecified quadrant: Secondary | ICD-10-CM

## 2022-09-13 ENCOUNTER — Other Ambulatory Visit: Payer: 59

## 2022-09-23 ENCOUNTER — Encounter: Payer: Self-pay | Admitting: Gastroenterology

## 2022-09-26 ENCOUNTER — Ambulatory Visit
Admission: RE | Admit: 2022-09-26 | Discharge: 2022-09-26 | Disposition: A | Discharge status: Home / Self Care | Payer: 59 | Source: Ambulatory Visit | Attending: Obstetrics & Gynecology | Admitting: Obstetrics & Gynecology

## 2022-09-26 ENCOUNTER — Ambulatory Visit: Payer: 59

## 2022-09-26 DIAGNOSIS — N6320 Unspecified lump in the left breast, unspecified quadrant: Secondary | ICD-10-CM

## 2022-11-11 ENCOUNTER — Encounter: Payer: Self-pay | Admitting: Gastroenterology

## 2022-12-03 ENCOUNTER — Ambulatory Visit (AMBULATORY_SURGERY_CENTER): Payer: 59 | Admitting: *Deleted

## 2022-12-03 VITALS — Ht 68.0 in | Wt 205.0 lb

## 2022-12-03 DIAGNOSIS — Z1211 Encounter for screening for malignant neoplasm of colon: Secondary | ICD-10-CM

## 2022-12-03 MED ORDER — NA SULFATE-K SULFATE-MG SULF 17.5-3.13-1.6 GM/177ML PO SOLN: 1.0000 | Freq: Once | ORAL | 0 refills | Status: AC

## 2022-12-03 NOTE — Progress Notes (Signed)
has egg  allergy no soy allergy known to patient  No issues known to pt with past sedation with any surgeries or procedures Patient denies ever being told they had issues or difficulty with intubation  No FH of Malignant Hyperthermia Pt is not on diet pills Pt is not on  home 02  Pt is not on blood thinners  Pt denies issues with constipation  No A fib or A flutter Have any cardiac testing pending--no Pt instructed to use Singlecare.com or GoodRx for a price reduction on prep    Patient's chart reviewed by Osvaldo Angst CNRA prior to previsit and patient appropriate for the [place].  Previsit completed and red dot placed by patient's name on their procedure day (on provider's schedule).

## 2022-12-10 ENCOUNTER — Encounter: Payer: Self-pay | Admitting: Gastroenterology

## 2022-12-26 ENCOUNTER — Ambulatory Visit (AMBULATORY_SURGERY_CENTER): Payer: 59 | Admitting: Gastroenterology

## 2022-12-26 ENCOUNTER — Encounter: Payer: Self-pay | Admitting: Gastroenterology

## 2022-12-26 VITALS — BP 147/92 | HR 77 | Temp 97.3°F | Resp 20 | Ht 68.0 in | Wt 205.0 lb

## 2022-12-26 DIAGNOSIS — Z1211 Encounter for screening for malignant neoplasm of colon: Secondary | ICD-10-CM | POA: Diagnosis present

## 2022-12-26 MED ORDER — SODIUM CHLORIDE 0.9 % IV SOLN: 500.0000 mL | Freq: Once | INTRAVENOUS | Status: DC

## 2022-12-26 NOTE — Progress Notes (Signed)
Pt's states no medical or surgical changes since previsit or office visit.  Charlsie Merles CRNA made aware of pt's egg allergy- she is able to eat eggs in cakes, etc..

## 2022-12-26 NOTE — Progress Notes (Signed)
Patient had a brief episode of bradycardia  to 30's (10-15 seconds) as I was suctioning insufflated air and about to complete the procedure.   Captured on monitor strip. Several dropped QRS complexes HR then returned to NSR in 70s.  Patient awoke feeling well.   Made her aware of the findings.  She has occasional brief episodes of lightheaded only if stands up too quickly.  Denies palpitations or chest pain episodes.  Asked her to contact primary care physician if she develops palpitations or chest pain, of is she develops episodes of lightheadedness that occur without having just changed position.  - H. Myrtie Neither, MD

## 2022-12-26 NOTE — Progress Notes (Signed)
1610 EKG showing heart in mid 20's, questionable Mobitz II, vss. MD made aware.

## 2022-12-26 NOTE — Progress Notes (Signed)
History and Physical:  This patient presents for endoscopic testing for: Encounter Diagnosis  Name Primary?   Special screening for malignant neoplasms, colon Yes    Average risk for colorectal cancer.  First screening exam. Patient denies chronic abdominal pain, rectal bleeding, constipation or diarrhea.   Patient is otherwise without complaints or active issues today.   Past Medical History: Past Medical History:  Diagnosis Date   Abnormal EKG 10/01/2018   Adjustment disorder with depressed mood 10/02/2018   Asthma    Cervical intraepithelial neoplasia grade III with severe dysplasia 02/08/1992   Depressive disorder 10/02/2018   Fatigue 10/02/2018   Female stress incontinence 10/02/2018   GERD (gastroesophageal reflux disease)    "occ"   Headache    Hypothyroidism    Thyroid disease    UTI (urinary tract infection)    Weight gain 10/02/2018     Past Surgical History: Past Surgical History:  Procedure Laterality Date   ABDOMINAL HYSTERECTOMY  2006   partial   WISDOM TOOTH EXTRACTION      Allergies: Allergies  Allergen Reactions   Clindamycin Other (See Comments)    Bloody stool   Egg-Derived Products Swelling   Sulfamethoxazole-Trimethoprim Itching    Outpatient Meds: Current Outpatient Medications  Medication Sig Dispense Refill   AMBULATORY NON FORMULARY MEDICATION 1 tablet daily. Vitamin d-3 & k-2     levothyroxine (SYNTHROID) 137 MCG tablet Take 137 mcg by mouth daily.     Probiotic Product (PROBIOTIC-10 PO) Take 1 tablet by mouth daily.     PROGESTERONE PO Take 100 mg by mouth daily.     buPROPion (WELLBUTRIN XL) 150 MG 24 hr tablet Take 150 mg by mouth daily. (Patient not taking: Reported on 12/03/2022)     EPINEPHrine 0.3 mg/0.3 mL IJ SOAJ injection Inject 0.3 mg into the muscle once as needed. Allergic reaction (Patient not taking: Reported on 12/03/2022)     estradiol (VIVELLE-DOT) 0.05 MG/24HR patch Place 1 patch onto the skin every 3 (three)  days.  2   Current Facility-Administered Medications  Medication Dose Route Frequency Provider Last Rate Last Admin   0.9 %  sodium chloride infusion  500 mL Intravenous Once Danis, Starr Lake III, MD          ___________________________________________________________________ Objective   Exam:  BP 138/81   Pulse 85   Temp (!) 97.3 F (36.3 C) (Skin)   Resp 17   Ht  (1.727 m)   Wt 205 lb (93 kg)   SpO2 100%   BMI 31.17 kg/m   CV: regular , S1/S2 Resp: clear to auscultation bilaterally, normal RR and effort noted GI: soft, no tenderness, with active bowel sounds.   Assessment: Encounter Diagnosis  Name Primary?   Special screening for malignant neoplasms, colon Yes     Plan: Colonoscopy  The benefits and risks of the planned procedure were described in detail with the patient or (when appropriate) their health care proxy.  Risks were outlined as including, but not limited to, bleeding, infection, perforation, adverse medication reaction leading to cardiac or pulmonary decompensation, pancreatitis (if ERCP).  The limitation of incomplete mucosal visualization was also discussed.  No guarantees or warranties were given.    The patient is appropriate for an endoscopic procedure in the ambulatory setting.   - Amada Jupiter, MD

## 2022-12-26 NOTE — Patient Instructions (Signed)
Please read handouts provided. Continue present medications. Repeat colonoscopy in 10 years for screening.   YOU HAD AN ENDOSCOPIC PROCEDURE TODAY AT THE [facility]:   Refer to the procedure report that was given to you for any specific questions about what was found during the examination.  If the procedure report does not answer your questions, please call your gastroenterologist to clarify.  If you requested that your care partner not be given the details of your procedure findings, then the procedure report has been included in a sealed envelope for you to review at your convenience later.  YOU SHOULD EXPECT: Some feelings of bloating in the abdomen. Passage of more gas than usual.  Walking can help get rid of the air that was put into your GI tract during the procedure and reduce the bloating. If you had a lower endoscopy (such as a colonoscopy or flexible sigmoidoscopy) you may notice spotting of blood in your stool or on the toilet paper. If you underwent a bowel prep for your procedure, you may not have a normal bowel movement for a few days.  Please Note:  You might notice some irritation and congestion in your nose or some drainage.  This is from the oxygen used during your procedure.  There is no need for concern and it should clear up in a day or so.  SYMPTOMS TO REPORT IMMEDIATELY:  Following lower endoscopy (colonoscopy or flexible sigmoidoscopy):  Excessive amounts of blood in the stool  Significant tenderness or worsening of abdominal pains  Swelling of the abdomen that is new, acute  Fever of 100F or higher.  For urgent or emergent issues, a gastroenterologist can be reached at any hour by calling [phone]. Do not use MyChart messaging for urgent concerns.    DIET:  We do recommend a small meal at first, but then you may proceed to your regular diet.  Drink plenty of fluids but you should avoid alcoholic beverages for 24 hours.  ACTIVITY:  You should  plan to take it easy for the rest of today and you should NOT DRIVE or use heavy machinery until tomorrow (because of the sedation medicines used during the test).    FOLLOW UP: Our staff will call the number listed on your records the next business day following your procedure.  We will call around 7:15- 8:00 am to check on you and address any questions or concerns that you may have regarding the information given to you following your procedure. If we do not reach you, we will leave a message.     If any biopsies were taken you will be contacted by phone or by letter within the next 1-3 weeks.  Please call us at [phone] if you have not heard about the biopsies in 3 weeks.    SIGNATURES/CONFIDENTIALITY: You and/or your care partner have signed paperwork which will be entered into your electronic medical record.  These signatures attest to the fact that that the information above on your After Visit Summary has been reviewed and is understood.  Full responsibility of the confidentiality of this discharge information lies with you and/or your care-partner. 

## 2022-12-26 NOTE — Progress Notes (Signed)
Report given to PACU, vss 

## 2022-12-26 NOTE — Op Note (Signed)
[facility] Patient Name: Brandi Hampton Procedure Date: 12/26/2022 7:52 AM MRN: [id number] Endoscopist: Sherilyn Cooter L. Myrtie Neither , MD, [id number] Age: 52 Referring MD:  Date of Birth: 1971-06-01 Gender: Female Account #: [id number] Procedure:                Colonoscopy Indications:              Screening for colorectal malignant neoplasm, This                            is the patient's first colonoscopy Medicines:                Monitored Anesthesia Care Procedure:                Pre-Anesthesia Assessment:                           - Prior to the procedure, a History and Physical                            was performed, and patient medications and                            allergies were reviewed. The patient's tolerance of                            previous anesthesia was also reviewed. The risks                            and benefits of the procedure and the sedation                            options and risks were discussed with the patient.                            All questions were answered, and informed consent                            was obtained. Prior Anticoagulants: The patient has                            taken no anticoagulant or antiplatelet agents. ASA                            Grade Assessment: II - A patient with mild systemic                            disease. After reviewing the risks and benefits,                            the patient was deemed in satisfactory condition to                            undergo the procedure.  After obtaining informed consent, the colonoscope                            was passed under direct vision. Throughout the                            procedure, the patient's blood pressure, pulse, and                            oxygen saturations were monitored continuously. The                            CF [id number] #[id number] was introduced through the anus                            and advanced to the  the cecum, identified by                            appendiceal orifice and ileocecal valve. The                            colonoscopy was somewhat difficult due to a                            redundant colon. Successful completion of the                            procedure was aided by using manual pressure and                            straightening and shortening the scope to obtain                            bowel loop reduction. The patient tolerated the                            procedure well. The quality of the bowel                            preparation was good after lavage. The ileocecal                            valve, appendiceal orifice, and rectum were                            photographed. The bowel preparation used was SUPREP. Scope In: 8:07:43 AM Scope Out: 8:23:57 AM Scope Withdrawal Time: 0 hours 12 minutes 12 seconds  Total Procedure Duration: 0 hours 16 minutes 14 seconds  Findings:                 The perianal and digital rectal examinations were                            normal.  The entire examined colon appeared normal on direct                            and retroflexion views.                           Repeat examination of right colon under NBI                            performed. Complications:            No immediate complications. Estimated Blood Loss:     Estimated blood loss: none. Impression:               - The entire examined colon is normal on direct and                            retroflexion views.                           - No specimens collected.                           - The GI Genius (intelligent endoscopy module),                            computer-aided polyp detection system powered by AI                            was utilized to detect colorectal polyps through                            enhanced visualization during colonoscopy. Recommendation:           - Patient has a contact number available for                             emergencies. The signs and symptoms of potential                            delayed complications were discussed with the                            patient. Return to normal activities tomorrow.                            Written discharge instructions were provided to the                            patient.                           - Resume previous diet.                           - Continue present medications.                           -  Repeat colonoscopy in 10 years for screening                            purposes. (consume more water with prep for next                            exam) Sherilyn Cooter L. Myrtie Neither, MD 12/26/2022 8:29:59 AM This report has been signed electronically.

## 2022-12-27 ENCOUNTER — Telehealth: Payer: Self-pay | Admitting: *Deleted

## 2022-12-27 NOTE — Telephone Encounter (Signed)
  Follow up Call-     12/26/2022    7:16 AM  Call back number  Post procedure Call Back phone  # [phone]  Permission to leave phone message Yes     Patient questions:  Do you have a fever, pain , or abdominal swelling? No. Pain Score  0 *  Have you tolerated food without any problems? Yes.    Have you been able to return to your normal activities? Yes.    Do you have any questions about your discharge instructions: Diet   No. Medications  No. Follow up visit  No.  Do you have questions or concerns about your Care? No.  Actions: * If pain score is 4 or above: No action needed, pain <4.

## 2023-08-15 ENCOUNTER — Other Ambulatory Visit: Payer: Self-pay | Admitting: Obstetrics & Gynecology

## 2023-08-15 DIAGNOSIS — Z1231 Encounter for screening mammogram for malignant neoplasm of breast: Secondary | ICD-10-CM

## 2023-09-05 ENCOUNTER — Other Ambulatory Visit: Payer: Self-pay | Admitting: Obstetrics & Gynecology

## 2023-09-05 DIAGNOSIS — Z1231 Encounter for screening mammogram for malignant neoplasm of breast: Secondary | ICD-10-CM

## 2023-09-11 DIAGNOSIS — Z1231 Encounter for screening mammogram for malignant neoplasm of breast: Secondary | ICD-10-CM

## 2023-10-09 ENCOUNTER — Ambulatory Visit
Admission: RE | Admit: 2023-10-09 | Discharge: 2023-10-09 | Disposition: A | Discharge status: Home / Self Care | Payer: BC Managed Care – PPO | Source: Ambulatory Visit | Attending: Obstetrics & Gynecology | Admitting: Obstetrics & Gynecology

## 2023-10-09 ENCOUNTER — Ambulatory Visit (HOSPITAL_COMMUNITY): Admission: RE | Admit: 2023-10-09 | Payer: BC Managed Care – PPO | Source: Ambulatory Visit

## 2023-10-09 DIAGNOSIS — Z1231 Encounter for screening mammogram for malignant neoplasm of breast: Secondary | ICD-10-CM
